# Patient Record
Sex: Female | Born: 1945 | Race: White | Hispanic: No | Marital: Married | Smoking: Never smoker
Health system: Southern US, Community
[De-identification: ages and names within clinical notes are randomized; demographics above are authoritative.]

## PROBLEM LIST (undated history)

## (undated) DIAGNOSIS — F329 Major depressive disorder, single episode, unspecified: Secondary | ICD-10-CM

## (undated) DIAGNOSIS — R300 Dysuria: Secondary | ICD-10-CM

## (undated) DIAGNOSIS — Z9114 Patient's other noncompliance with medication regimen: Secondary | ICD-10-CM

## (undated) DIAGNOSIS — M545 Low back pain, unspecified: Secondary | ICD-10-CM

## (undated) DIAGNOSIS — J45909 Unspecified asthma, uncomplicated: Secondary | ICD-10-CM

## (undated) DIAGNOSIS — R600 Localized edema: Secondary | ICD-10-CM

## (undated) DIAGNOSIS — K209 Esophagitis, unspecified without bleeding: Secondary | ICD-10-CM

## (undated) DIAGNOSIS — L03115 Cellulitis of right lower limb: Secondary | ICD-10-CM

## (undated) DIAGNOSIS — I10 Essential (primary) hypertension: Secondary | ICD-10-CM

## (undated) DIAGNOSIS — R51 Headache: Secondary | ICD-10-CM

## (undated) DIAGNOSIS — G4700 Insomnia, unspecified: Secondary | ICD-10-CM

## (undated) DIAGNOSIS — Z8709 Personal history of other diseases of the respiratory system: Secondary | ICD-10-CM

## (undated) DIAGNOSIS — L02415 Cutaneous abscess of right lower limb: Secondary | ICD-10-CM

## (undated) DIAGNOSIS — R112 Nausea with vomiting, unspecified: Secondary | ICD-10-CM

## (undated) DIAGNOSIS — R7303 Prediabetes: Secondary | ICD-10-CM

## (undated) DIAGNOSIS — Z91148 Patient's other noncompliance with medication regimen for other reason: Secondary | ICD-10-CM

## (undated) DIAGNOSIS — R059 Cough, unspecified: Secondary | ICD-10-CM

## (undated) DIAGNOSIS — R351 Nocturia: Secondary | ICD-10-CM

## (undated) DIAGNOSIS — N3020 Other chronic cystitis without hematuria: Secondary | ICD-10-CM

## (undated) DIAGNOSIS — Z713 Dietary counseling and surveillance: Secondary | ICD-10-CM

## (undated) DIAGNOSIS — R079 Chest pain, unspecified: Secondary | ICD-10-CM

## (undated) DIAGNOSIS — R011 Cardiac murmur, unspecified: Secondary | ICD-10-CM

## (undated) DIAGNOSIS — D1730 Benign lipomatous neoplasm of skin and subcutaneous tissue of unspecified sites: Secondary | ICD-10-CM

## (undated) DIAGNOSIS — E7800 Pure hypercholesterolemia, unspecified: Secondary | ICD-10-CM

## (undated) DIAGNOSIS — F32A Depression, unspecified: Secondary | ICD-10-CM

## (undated) DIAGNOSIS — E559 Vitamin D deficiency, unspecified: Secondary | ICD-10-CM

## (undated) DIAGNOSIS — Z9889 Other specified postprocedural states: Secondary | ICD-10-CM

## (undated) DIAGNOSIS — Z79899 Other long term (current) drug therapy: Secondary | ICD-10-CM

## (undated) DIAGNOSIS — L02419 Cutaneous abscess of limb, unspecified: Secondary | ICD-10-CM

## (undated) DIAGNOSIS — T8859XA Other complications of anesthesia, initial encounter: Secondary | ICD-10-CM

## (undated) DIAGNOSIS — R519 Headache, unspecified: Secondary | ICD-10-CM

## (undated) DIAGNOSIS — R1904 Left lower quadrant abdominal swelling, mass and lump: Secondary | ICD-10-CM

## (undated) DIAGNOSIS — T4145XA Adverse effect of unspecified anesthetic, initial encounter: Secondary | ICD-10-CM

## (undated) DIAGNOSIS — F419 Anxiety disorder, unspecified: Secondary | ICD-10-CM

## (undated) DIAGNOSIS — D649 Anemia, unspecified: Secondary | ICD-10-CM

## (undated) DIAGNOSIS — R21 Rash and other nonspecific skin eruption: Secondary | ICD-10-CM

## (undated) DIAGNOSIS — R5381 Other malaise: Secondary | ICD-10-CM

## (undated) DIAGNOSIS — R05 Cough: Secondary | ICD-10-CM

## (undated) DIAGNOSIS — M1990 Unspecified osteoarthritis, unspecified site: Secondary | ICD-10-CM

## (undated) DIAGNOSIS — L03119 Cellulitis of unspecified part of limb: Secondary | ICD-10-CM

## (undated) DIAGNOSIS — R5383 Other fatigue: Secondary | ICD-10-CM

## (undated) DIAGNOSIS — I639 Cerebral infarction, unspecified: Secondary | ICD-10-CM

## (undated) DIAGNOSIS — IMO0001 Reserved for inherently not codable concepts without codable children: Secondary | ICD-10-CM

## (undated) DIAGNOSIS — M76899 Other specified enthesopathies of unspecified lower limb, excluding foot: Secondary | ICD-10-CM

## (undated) DIAGNOSIS — K219 Gastro-esophageal reflux disease without esophagitis: Secondary | ICD-10-CM

## (undated) DIAGNOSIS — G8929 Other chronic pain: Secondary | ICD-10-CM

## (undated) DIAGNOSIS — K5900 Constipation, unspecified: Secondary | ICD-10-CM

## (undated) HISTORY — PX: SALIVARY GLAND SURGERY: SHX768

## (undated) HISTORY — PX: CARDIAC CATHETERIZATION: SHX172

## (undated) HISTORY — PX: TUMOR EXCISION: SHX421

## (undated) HISTORY — PX: ANKLE SURGERY: SHX546

## (undated) HISTORY — PX: APPENDECTOMY: SHX54

## (undated) HISTORY — PX: ABDOMINAL HYSTERECTOMY: SHX81

---

## 2006-05-18 ENCOUNTER — Encounter: Admission: RE | Admit: 2006-05-18 | Discharge: 2006-05-18 | Payer: Self-pay | Admitting: Neurosurgery

## 2009-01-04 DIAGNOSIS — E785 Hyperlipidemia, unspecified: Secondary | ICD-10-CM | POA: Insufficient documentation

## 2010-02-27 ENCOUNTER — Encounter: Payer: Self-pay | Admitting: Neurosurgery

## 2013-05-07 ENCOUNTER — Other Ambulatory Visit: Payer: Self-pay | Admitting: Internal Medicine

## 2013-05-07 DIAGNOSIS — M5416 Radiculopathy, lumbar region: Secondary | ICD-10-CM

## 2013-05-15 ENCOUNTER — Ambulatory Visit
Admission: RE | Admit: 2013-05-15 | Discharge: 2013-05-15 | Disposition: A | Discharge status: Home / Self Care | Payer: BC Managed Care – PPO | Source: Ambulatory Visit | Attending: Internal Medicine | Admitting: Internal Medicine

## 2013-05-15 DIAGNOSIS — M5416 Radiculopathy, lumbar region: Secondary | ICD-10-CM

## 2015-07-16 ENCOUNTER — Other Ambulatory Visit: Payer: Self-pay | Admitting: Anesthesiology

## 2015-07-20 ENCOUNTER — Encounter (HOSPITAL_COMMUNITY): Payer: Self-pay

## 2015-07-20 ENCOUNTER — Other Ambulatory Visit: Payer: Self-pay

## 2015-07-20 ENCOUNTER — Emergency Department (HOSPITAL_COMMUNITY)
Admission: EM | Admit: 2015-07-20 | Discharge: 2015-07-20 | Disposition: A | Discharge status: Home / Self Care | Payer: Medicare Other | Attending: Emergency Medicine | Admitting: Emergency Medicine

## 2015-07-20 ENCOUNTER — Encounter (HOSPITAL_COMMUNITY)
Admission: RE | Admit: 2015-07-20 | Discharge: 2015-07-20 | Disposition: A | Discharge status: Home / Self Care | Payer: Medicare Other | Source: Ambulatory Visit | Attending: Anesthesiology | Admitting: Anesthesiology

## 2015-07-20 ENCOUNTER — Encounter (HOSPITAL_COMMUNITY): Payer: Self-pay | Admitting: Vascular Surgery

## 2015-07-20 ENCOUNTER — Encounter (HOSPITAL_COMMUNITY): Payer: Self-pay | Admitting: Nurse Practitioner

## 2015-07-20 ENCOUNTER — Emergency Department (HOSPITAL_COMMUNITY): Payer: Medicare Other

## 2015-07-20 DIAGNOSIS — Z91010 Allergy to peanuts: Secondary | ICD-10-CM | POA: Diagnosis not present

## 2015-07-20 DIAGNOSIS — F419 Anxiety disorder, unspecified: Secondary | ICD-10-CM | POA: Insufficient documentation

## 2015-07-20 DIAGNOSIS — M4316 Spondylolisthesis, lumbar region: Secondary | ICD-10-CM | POA: Insufficient documentation

## 2015-07-20 DIAGNOSIS — K219 Gastro-esophageal reflux disease without esophagitis: Secondary | ICD-10-CM | POA: Insufficient documentation

## 2015-07-20 DIAGNOSIS — Z8673 Personal history of transient ischemic attack (TIA), and cerebral infarction without residual deficits: Secondary | ICD-10-CM | POA: Insufficient documentation

## 2015-07-20 DIAGNOSIS — J45909 Unspecified asthma, uncomplicated: Secondary | ICD-10-CM | POA: Insufficient documentation

## 2015-07-20 DIAGNOSIS — R079 Chest pain, unspecified: Secondary | ICD-10-CM | POA: Diagnosis not present

## 2015-07-20 DIAGNOSIS — E7800 Pure hypercholesterolemia, unspecified: Secondary | ICD-10-CM | POA: Insufficient documentation

## 2015-07-20 DIAGNOSIS — M1990 Unspecified osteoarthritis, unspecified site: Secondary | ICD-10-CM | POA: Insufficient documentation

## 2015-07-20 DIAGNOSIS — G8929 Other chronic pain: Secondary | ICD-10-CM | POA: Insufficient documentation

## 2015-07-20 DIAGNOSIS — Z01818 Encounter for other preprocedural examination: Secondary | ICD-10-CM | POA: Diagnosis not present

## 2015-07-20 DIAGNOSIS — R51 Headache: Secondary | ICD-10-CM | POA: Diagnosis not present

## 2015-07-20 DIAGNOSIS — M5416 Radiculopathy, lumbar region: Secondary | ICD-10-CM | POA: Insufficient documentation

## 2015-07-20 DIAGNOSIS — I10 Essential (primary) hypertension: Secondary | ICD-10-CM | POA: Insufficient documentation

## 2015-07-20 DIAGNOSIS — F329 Major depressive disorder, single episode, unspecified: Secondary | ICD-10-CM | POA: Insufficient documentation

## 2015-07-20 DIAGNOSIS — Z79899 Other long term (current) drug therapy: Secondary | ICD-10-CM | POA: Insufficient documentation

## 2015-07-20 DIAGNOSIS — R7303 Prediabetes: Secondary | ICD-10-CM | POA: Diagnosis not present

## 2015-07-20 DIAGNOSIS — Z01812 Encounter for preprocedural laboratory examination: Secondary | ICD-10-CM | POA: Insufficient documentation

## 2015-07-20 HISTORY — DX: Other specified postprocedural states: Z98.890

## 2015-07-20 HISTORY — DX: Headache: R51

## 2015-07-20 HISTORY — DX: Cerebral infarction, unspecified: I63.9

## 2015-07-20 HISTORY — DX: Headache, unspecified: R51.9

## 2015-07-20 HISTORY — DX: Esophagitis, unspecified: K20.9

## 2015-07-20 HISTORY — DX: Prediabetes: R73.03

## 2015-07-20 HISTORY — DX: Other chronic cystitis without hematuria: N30.20

## 2015-07-20 HISTORY — DX: Depression, unspecified: F32.A

## 2015-07-20 HISTORY — DX: Anxiety disorder, unspecified: F41.9

## 2015-07-20 HISTORY — DX: Low back pain: M54.5

## 2015-07-20 HISTORY — DX: Patient's other noncompliance with medication regimen: Z91.14

## 2015-07-20 HISTORY — DX: Pure hypercholesterolemia, unspecified: E78.00

## 2015-07-20 HISTORY — DX: Personal history of other diseases of the respiratory system: Z87.09

## 2015-07-20 HISTORY — DX: Esophagitis, unspecified without bleeding: K20.90

## 2015-07-20 HISTORY — DX: Other fatigue: R53.83

## 2015-07-20 HISTORY — DX: Adverse effect of unspecified anesthetic, initial encounter: T41.45XA

## 2015-07-20 HISTORY — DX: Nausea with vomiting, unspecified: R11.2

## 2015-07-20 HISTORY — DX: Cough: R05

## 2015-07-20 HISTORY — DX: Major depressive disorder, single episode, unspecified: F32.9

## 2015-07-20 HISTORY — DX: Other specified enthesopathies of unspecified lower limb, excluding foot: M76.899

## 2015-07-20 HISTORY — DX: Dietary counseling and surveillance: Z71.3

## 2015-07-20 HISTORY — DX: Vitamin D deficiency, unspecified: E55.9

## 2015-07-20 HISTORY — DX: Unspecified osteoarthritis, unspecified site: M19.90

## 2015-07-20 HISTORY — DX: Nocturia: R35.1

## 2015-07-20 HISTORY — DX: Other malaise: R53.81

## 2015-07-20 HISTORY — DX: Reserved for inherently not codable concepts without codable children: IMO0001

## 2015-07-20 HISTORY — DX: Other chronic pain: G89.29

## 2015-07-20 HISTORY — DX: Essential (primary) hypertension: I10

## 2015-07-20 HISTORY — DX: Patient's other noncompliance with medication regimen for other reason: Z91.148

## 2015-07-20 HISTORY — DX: Gastro-esophageal reflux disease without esophagitis: K21.9

## 2015-07-20 HISTORY — DX: Cough, unspecified: R05.9

## 2015-07-20 HISTORY — DX: Other complications of anesthesia, initial encounter: T88.59XA

## 2015-07-20 HISTORY — DX: Dysuria: R30.0

## 2015-07-20 HISTORY — DX: Left lower quadrant abdominal swelling, mass and lump: R19.04

## 2015-07-20 HISTORY — DX: Rash and other nonspecific skin eruption: R21

## 2015-07-20 HISTORY — DX: Benign lipomatous neoplasm of skin and subcutaneous tissue of unspecified sites: D17.30

## 2015-07-20 HISTORY — DX: Chest pain, unspecified: R07.9

## 2015-07-20 HISTORY — DX: Unspecified asthma, uncomplicated: J45.909

## 2015-07-20 HISTORY — DX: Localized edema: R60.0

## 2015-07-20 HISTORY — DX: Cardiac murmur, unspecified: R01.1

## 2015-07-20 HISTORY — DX: Constipation, unspecified: K59.00

## 2015-07-20 HISTORY — DX: Low back pain, unspecified: M54.50

## 2015-07-20 HISTORY — DX: Other long term (current) drug therapy: Z79.899

## 2015-07-20 HISTORY — DX: Insomnia, unspecified: G47.00

## 2015-07-20 HISTORY — DX: Anemia, unspecified: D64.9

## 2015-07-20 LAB — CBC
HCT: 41.6 % (ref 36.0–46.0)
HEMOGLOBIN: 12.7 g/dL (ref 12.0–15.0)
MCH: 25.5 pg — AB (ref 26.0–34.0)
MCHC: 30.5 g/dL (ref 30.0–36.0)
MCV: 83.5 fL (ref 78.0–100.0)
Platelets: 277 10*3/uL (ref 150–400)
RBC: 4.98 MIL/uL (ref 3.87–5.11)
RDW: 14.5 % (ref 11.5–15.5)
WBC: 9 10*3/uL (ref 4.0–10.5)

## 2015-07-20 LAB — BASIC METABOLIC PANEL
Anion gap: 9 (ref 5–15)
Anion gap: 7 (ref 5–15)
BUN: 15 mg/dL (ref 6–20)
BUN: 13 mg/dL (ref 6–20)
CALCIUM: 9.2 mg/dL (ref 8.9–10.3)
CHLORIDE: 106 mmol/L (ref 101–111)
CHLORIDE: 104 mmol/L (ref 101–111)
CO2: 24 mmol/L (ref 22–32)
CO2: 28 mmol/L (ref 22–32)
CREATININE: 0.77 mg/dL (ref 0.44–1.00)
CREATININE: 0.81 mg/dL (ref 0.44–1.00)
Calcium: 9.4 mg/dL (ref 8.9–10.3)
GFR calc Af Amer: >60 mL/min (ref >60)
GFR calc non Af Amer: >60 mL/min (ref >60)
GFR calc non Af Amer: >60 mL/min (ref >60)
GLUCOSE: 80 mg/dL (ref 65–99)
GLUCOSE: 127 mg/dL — AB (ref 65–99)
POTASSIUM: 4 mmol/L (ref 3.5–5.1)
Potassium: 4.1 mmol/L (ref 3.5–5.1)
SODIUM: 139 mmol/L (ref 135–145)
Sodium: 139 mmol/L (ref 135–145)

## 2015-07-20 LAB — CBC WITH DIFFERENTIAL/PLATELET
Basophils Absolute: 0 10*3/uL (ref 0.0–0.1)
Basophils Relative: 0 %
Eosinophils Absolute: 0.6 10*3/uL (ref 0.0–0.7)
Eosinophils Relative: 7 %
HEMATOCRIT: 40 % (ref 36.0–46.0)
HEMOGLOBIN: 12.2 g/dL (ref 12.0–15.0)
LYMPHS ABS: 2.7 10*3/uL (ref 0.7–4.0)
LYMPHS PCT: 29 %
MCH: 25.4 pg — AB (ref 26.0–34.0)
MCHC: 30.5 g/dL (ref 30.0–36.0)
MCV: 83.2 fL (ref 78.0–100.0)
MONO ABS: 0.5 10*3/uL (ref 0.1–1.0)
MONOS PCT: 6 %
NEUTROS ABS: 5.4 10*3/uL (ref 1.7–7.7)
NEUTROS PCT: 58 %
Platelets: 279 10*3/uL (ref 150–400)
RBC: 4.81 MIL/uL (ref 3.87–5.11)
RDW: 14.6 % (ref 11.5–15.5)
WBC: 9.3 10*3/uL (ref 4.0–10.5)

## 2015-07-20 LAB — I-STAT TROPONIN, ED
TROPONIN I, POC: 0.01 ng/mL (ref 0.00–0.08)
Troponin i, poc: 0.01 ng/mL (ref 0.00–0.08)

## 2015-07-20 LAB — SURGICAL PCR SCREEN
MRSA, PCR: NEGATIVE
Staphylococcus aureus: NEGATIVE

## 2015-07-20 MED ORDER — METOPROLOL TARTRATE 25 MG PO TABS
100.0000 mg | ORAL_TABLET | Freq: Once | ORAL | Status: AC
  Administered 2015-07-20: 100 mg via ORAL
  Filled 2015-07-20: qty 4

## 2015-07-20 NOTE — ED Provider Notes (Signed)
CSN: [id number]     Arrival date & time 07/20/15  1720 History   First MD Initiated Contact with Patient 07/20/15 1946     Chief Complaint  Patient presents with  . Chest Pain    HPI Pt was getting a pre op evaluation for a spinal cord stimulator.  WHile she was there they noticed that her blood pressure was very elevated.     SHe has not felt well today.  She thought it might be her anxiety.   She has been having trouble with her chest hurting her.  Off and on this has been happening for a year or two.  She sees cardiologist a danville who is managing it.  SHe has a history of her having high blood pressure and what sounds like renal artery stenosis.  Her doctors have had to do multiple adjustments of her blood pressure over the years.  She has not had much to eat today because of the preop evaluation and she did not take her blood pressure medications was early this morning. She denies shortness of breath.  No vomiting or diarrhea. Past Medical History  Diagnosis Date  . Complication of anesthesia     "stopped breathing with tumor removal surgery, not happened since then"  . PONV (postoperative nausea and vomiting)   . Asthma   . Edema extremities   . Hypertension   . Vitamin D deficiency   . Noncompliance with medications   . Chronic pain   . Ischemic cerebrovascular accident (CVA) of frontal lobe ([place])   . Low back pain   . High risk medication use   . GERD (gastroesophageal reflux disease)   . Anemia   . Abdominal mass, LLQ (left lower quadrant)   . Dysuria   . Osteoarthritis   . Enthesopathy of knee   . Dietary counseling   . Depression   . Anxiety   . Prediabetes   . Nocturia   . Rash   . Hypercholesterolemia   . Chest pain   . Malaise and fatigue   . Cough   . Insomnia   . Lipoma of skin   . Constipation   . Esophagitis   . Heart murmur   . Shortness of breath dyspnea   . History of bronchitis   . Bladder infection, chronic   . Headache    Past Surgical  History  Procedure Laterality Date  . Salivary gland surgery    . Abdominal hysterectomy    . Ankle surgery Right   . Appendectomy    . Tumor excision     History reviewed. No pertinent family history. Social History  Substance Use Topics  . Smoking status: Never Smoker   . Smokeless tobacco: Never Used  . Alcohol Use: No   OB History    No data available     Review of Systems  All other systems reviewed and are negative.     Allergies  Codeine; Penicillins; Aspirin; Cephalexin; Iodinated diagnostic agents; Morphine; Nitrofuran derivatives; Peanut (diagnostic); Shellfish allergy; Sulfa antibiotics; and Tetracycline  Home Medications   Prior to Admission medications   Medication Sig Start Date End Date Taking? Authorizing Provider  acetaminophen (TYLENOL) 500 MG tablet Take 1,000 mg by mouth every 6 (six) hours as needed for moderate pain.    Historical Provider, MD  albuterol (PROVENTIL) (2.5 MG/3ML) 0.083% nebulizer solution Take 2.5 mg by nebulization every 6 (six) hours as needed for wheezing or shortness of breath.    Historical Provider,  MD  atorvastatin (LIPITOR) 40 MG tablet Take 40 mg by mouth daily.    Historical Provider, MD  butalbital-acetaminophen-caffeine (FIORICET, ESGIC) 50-325-40 MG tablet Take 1 tablet by mouth every 4 (four) hours as needed for headache.    Historical Provider, MD  calcium carbonate (TUMS - DOSED IN MG ELEMENTAL CALCIUM) 500 MG chewable tablet Chew 1 tablet by mouth daily.    Historical Provider, MD  chlorthalidone (HYGROTON) 25 MG tablet Take 25 mg by mouth daily.    Historical Provider, MD  clonazePAM (KLONOPIN) 0.5 MG tablet Take 0.5 mg by mouth 2 (two) times daily as needed for anxiety.    Historical Provider, MD  cyclobenzaprine (FLEXERIL) 10 MG tablet Take 10 mg by mouth 3 (three) times daily as needed for muscle spasms.    Historical Provider, MD  diphenhydrAMINE (BENADRYL) 25 mg capsule Take 25 mg by mouth every 6 (six) hours as  needed for allergies.    Historical Provider, MD  divalproex (DEPAKOTE ER) 500 MG 24 hr tablet Take 500 mg by mouth daily.    Historical Provider, MD  ferrous sulfate 325 (65 FE) MG tablet Take 325 mg by mouth daily with breakfast.    Historical Provider, MD  furosemide (LASIX) 40 MG tablet Take 40 mg by mouth daily as needed for fluid or edema.    Historical Provider, MD  HYDROcodone-acetaminophen (NORCO) 10-325 MG tablet Take 1 tablet by mouth every 6 (six) hours as needed.    Historical Provider, MD  isosorbide dinitrate (ISORDIL) 30 MG tablet Take 30 mg by mouth at bedtime.    Historical Provider, MD  lansoprazole (PREVACID) 30 MG capsule Take 30 mg by mouth daily at 12 noon.    Historical Provider, MD  lisinopril (PRINIVIL,ZESTRIL) 20 MG tablet Take 20 mg by mouth daily.    Historical Provider, MD  magnesium citrate ([employer] MAGNESIUM CITRATE) SOLN Take 0.5 Bottles by mouth daily.    Historical Provider, MD  Melatonin 3 MG TABS Take 3 mg by mouth at bedtime.    Historical Provider, MD  metoprolol (LOPRESSOR) 100 MG tablet Take 100 mg by mouth 2 (two) times daily.    Historical Provider, MD  NIFEdipine (PROCARDIA-XL/ADALAT CC) 60 MG 24 hr tablet Take 60 mg by mouth daily.    Historical Provider, MD  potassium chloride SA (K-DUR,KLOR-CON) 20 MEQ tablet Take 20 mEq by mouth daily.    Historical Provider, MD  ranitidine (ZANTAC) 150 MG tablet Take 150 mg by mouth at bedtime.    Historical Provider, MD  sertraline (ZOLOFT) 25 MG tablet Take 25 mg by mouth daily.    Historical Provider, MD  spironolactone (ALDACTONE) 25 MG tablet Take 25 mg by mouth daily.    Historical Provider, MD  traZODone (DESYREL) 50 MG tablet Take 50 mg by mouth at bedtime.    Historical Provider, MD  Vitamin D, Ergocalciferol, (DRISDOL) 50000 units CAPS capsule Take 50,000 Units by mouth 3 (three) times a week.    Historical Provider, MD   BP 216/112 mmHg  Pulse 62  Temp(Src) 98 F (36.7 C) (Oral)  Resp 18  SpO2  96% Physical Exam  Constitutional: She appears well-developed and well-nourished. No distress.  HENT:  Head: Normocephalic and atraumatic.  Right Ear: External ear normal.  Left Ear: External ear normal.  Eyes: Conjunctivae are normal. Right eye exhibits no discharge. Left eye exhibits no discharge. No scleral icterus.  Neck: Neck supple. No tracheal deviation present.  Cardiovascular: Normal rate, regular rhythm and intact distal  pulses.   Pulmonary/Chest: Effort normal and breath sounds normal. No stridor. No respiratory distress. She has no wheezes. She has no rales.  Abdominal: Soft. Bowel sounds are normal. She exhibits no distension. There is no tenderness. There is no rebound and no guarding.  Musculoskeletal: She exhibits no edema or tenderness.  Neurological: She is alert. She has normal strength. She displays atrophy. No cranial nerve deficit (no facial droop, extraocular movements intact, no slurred speech) or sensory deficit. She exhibits normal muscle tone. She displays no seizure activity. Coordination normal.  Weakness lower extremities (pt uses a scooter)  Skin: Skin is warm and dry. No rash noted.  Psychiatric: She has a normal mood and affect.  Nursing note and vitals reviewed.   ED Course  Procedures (including critical care time) [place], ED    Imaging Review Dg Chest 2 View  07/20/2015  CLINICAL DATA:  Intermittent chest pain for several months. EXAM: CHEST  2 VIEW COMPARISON:  None FINDINGS: Lungs are clear without airspace disease or pulmonary edema. Dextroscoliosis of the thoracolumbar spine. Heart size is within normal limits. No large pleural effusions. IMPRESSION: No active cardiopulmonary disease. Scoliosis. Electronically Signed   By: Markus Daft M.D.   On: 07/20/2015 18:23   I have personally reviewed and evaluated these images and lab results as part of my medical decision-making.   EKG  Interpretation  Date/Time:  Tuesday July 20 2015 17:25:30 EDT Ventricular Rate:  62 PR Interval:  152 QRS Duration: 82 QT Interval:  416 QTC Calculation: 422 R Axis:   -33 Text Interpretation:  Normal sinus rhythm Left axis deviation Abnormal ECG No significant change since last tracing Confirmed by Laquida Cotrell  MD-J, Candence Sease [id number]) on 07/20/2015 10:22:47 PM         MDM   Final diagnoses:  Essential hypertension    Pt sent to the ED for persistent hypertension and episodes of chest pain which is not new for her.  She is under the care of a cardiologist in [place] for this.   Heart Score 4, moderate risk.  Sx are atypical.  ED evaluation is reassuring.  2 sets of cardiac enzymes negative.  Doubt ACS.  BP improved with her evening dose of metoprolol.   Systolic of 0000000 at the bedside.  Pt is ready to go home.  Will follow up with her cardiologist regarding pre op clearance  Dorie Rank, MD 07/20/15 2225

## 2015-07-20 NOTE — Progress Notes (Addendum)
Anesthesia note: Patient is a 70 year old female scheduled for lumbar spinal cord stimulator insertion on 07/23/15 (first case) by Dr. Maryjean Ka.  She resides in [place], [place].  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 She reported respiratory insufficiency with tumor removal surgery.  - PCP is Dr. Doristine Mango in [place], [place]. Last visit 04/30/15. - Neurologist is Dr. Barb Merino, reportedly seen within the last month.  - Cardiologist is Dr. Carrolyn Leigh, reportedly seen within the past month. Reports history of cardiac cath within the past five years (no PCI) and stress test ~ 10 years ago.  She reports taking metoprolol 50mg  PRN for chest tightness.  Meds include albuterol, Lipitor, Fioricet, Tums, chlorthalidone 25mg  daily, Klonopin, Flexeril, Depakote ER, 65 Fe, Lasix 40mg  daily PRN, Norco, isosorbide dinitrate 30mg  at HS, Prevacid, lisinopril 20mg  daily, melatonin, metoprolol 100mg  BID, nifedipine XL 60mg  daily, KCl 33mEq daily, ranitidine, Zoloft, spironolactone 25mg  daily, trazodone, Vitamin D. She took her AM medications just after 7AM this morning and took a metoprolol 50mg  tablet around 1PM for chest tightness.  PAT Vitals: BP 230/105, 209/108, 202/93, HR 58, RR 18, T 37.4C, O2 sat 99%. WT 120 lb, 54 kg. BMI 22.69. (I spoke with Anderson Malta at Dr. Donell Sievert office. She reports BPs there have been SBP 170-197/84-100. BP on 04/30/15 at her PCP office was 150/80 and 170/90, both in her LUE.) I rechecked BP manually and got 212/110 LUE and 190/104  RUE (done just before transfer to ED).   Exam shows a pleasant Caucasian female in NAD. She is in a motorized chair. Her husband is at her side. Heart RRR, I/VI SEM. Lungs clear. She is wearing BLE compression stockings. Facial symmetry. She has RLE weakness and valgus deformity of her RLE (not new per patient and her husband). Her right grip seems slightly weaker than her left (patient has not noticed, but said this was mentioned by Dr. Casandra Doffing in the past). She reports 6/10 headache today which is not unusual for her. She gets occasional blurry vision, but denied any currently. She also reports that up to once a week she takes metoprolol 50mg  tablets for chest tightness with symptoms primarily in her upper chest to sternal notch region. Symptoms are occasionally associated with anxiety, diaphoresis, palpitations, and SOB (feeling like she needs to take deep breaths). Symptoms are not exacerbated by exercise, but admits that her activity is limited (uses a cane at home, but motorized chair when out of the house). Her last episode was around 1PM today, but said symptoms were improving by the time she arrived in PAT. She thinks she had a cardiac cath about five years ago that showed no significant blockages. She has not been told that her murmur was significant. On the other hand, she and her husband mentioned that she was declined for previous TKA (at [facility]) and a "12-13 hour" back surgery [facility]) because of her cardiac status. She thinks she had an echo within the last five years. Currently, I do not have any recent cardiology records, but requests have been requested.   07/20/15 EKG (in PAT): SR with PACs, LVH with repolarization abnormality.  She underwent renal arteriogram 6/26/ for refractory HTN (  was in the HTN-3 Study at [facility]). Results (see Care Everywhere) showed: Impression: 1. Abdominal aortogram and bilateral selective renal arteriograms for evaluation of HTN-3 study. Normal angiogram 2. The  patient was randomized per protocol.  01/16/11 Renal U/S ([facility]; Care Everywhere): Conclusions:  The right kidney was normal in size. There was no evidence of occlusive disease in the right renal artery. The left kidney was normal in size. There was no evidence of occlusive disease in the left renal artery.  09/14/00 Echo ([facility]; Care Everywhere): INTERPRETATION --------------------------------------------------------------- NORMAL LEFT VENTRICULAR FUNCTION WITH MILD LVH VALVULAR REGURGITATION: TRIVIAL MR NO VALVULAR STENOSIS  CBC and BMET results noted.  PAT RN called Dr. Real Cons office this afternoon, but was on hold for 30 minutes and never spoke with anyone. We were attempting to get more information about her cardiac status and also direction on how best to manage her uncontrolled hypertension acutely and in the long-term. Since we were unable to get in touch with Dr. Gibson Ramp late this afternoon, I discussed further with anesthesiologist Dr. Therisa Doyne. In a patient with unknown cardiac status with (recurrent) chest pain this afternoon, significantly elevated BP with headache would advise the safest course of action would be for her to be further evaluated in the ED. I discussed with patient and her husband who are agreeable to transfer to the ED. I escorted them to the ED and reported off to ED staff there. I also notified Anderson Malta at Dr. Donell Sievert office this afternoon of uncontrolled BP and need for pre-operative input for cardiology. (Even if ultimately "cleared" by cardiology, a significantly elevated BP on the day of her procedure may still delay or cancel her procedure.) I will follow-up on happenings during/after her ED visit and plan to update Dr. Donell Sievert staff tomorrow.  George Hugh [facility] Short Stay Center/Anesthesiology Phone [phone] 07/20/2015 6:02 PM  Addendum: Last office note from Dr. Gibson Ramp just received. She was seen on 05/19/15. She apparently had had a  recent hospitalization for hypertension. At follow-up, her home SBP were reported as 160-170's. If her BP was still elevated at her six month follow-up then he would consider increasing her nifedipine. Overall, he felt she was doing well from a cardiac standpoint. By notes, "12/13/2012 Cath LMCA: nl LAD: mid40% Cx: nl RCA: nl, DHV." 07/21/14 Hospital echo "EF 60/LVH diast dysf/RVE/LAE/mild TR, MR."  06/25/14 Echo ([facility]): Conclusions: 1. LV normal in size and systolic function. Estimated EF 60-65%. Mild to moderate concentric LVH with basal asymmetric septal hypertrophy. Grade 1 diastolic dysfunction suggestive of impaired relaxation. 2. Right ventricle is normal in size and systolic function. 3. Moderate biatrial dilatation. 4. The AV is calcified with mild AS (mean gradient 9 mmHg, peak velocity 1.9 m/s). 5. No other hemodynamically significant valvular abnormalities.  [facility] records still pending.  George Hugh [facility] Short Stay Center/Anesthesiology Phone [phone] 07/20/2015 6:42 PM  Addendum: [facility] ED records reviewed. She was discharged last night. BP was down to 188/70 at discharge after her evening dose of metoprolol. She was to follow-up with her cardiologist regarding pre-operative clearance and HTN. Cardiac enzymes were negative X 2.   [facility] records received. To clarify her possible CVA/seizure history, records indicate that she was admitted 07/19/14-07/21/14 for altered mental status (she told me she couldn't remember her bank card pin number) and hypertensive emergency. MRI showed no acute infarct, but progressive white matter disease. EEG was cut short (by patient) and showed findings that could implicate a  decrease in seizure threshold although no electrographioc subclinical seizure. There was no significant carotid artery disease, no afib by EKG, and no thrombus seen on echo. She was discharged with cardiology and neurology follow-up. Patient has since bee  started on Depakote, although I do not currently have any recent neurology records.   2014 cath report was not faxed. Will request.   07/21/14 Echo: Impression: 1. Normal LV systolic function. EF 60%. 2. Concentric LVH and diastolic dysfunction. 3. Dilated RV. 4. Dilated LA. 5. Mild TR and aortic incompetence.  07/20/14 Carotid U/S: Impression: < 50% bilateral ICA stenosis.  07/20/14 EEG: Study was stopped halfway through due to the patient's request asking the test to be stopped as per EEG technician. There are mild cortical, neuronal, physiological, and/or structural dysfunction over the left temporal lobe, left more than right. This could implicate some mild decreased threshold for seizure for seizure disorder. If clinically warranted, advice to repeat the test. Again, no electrographic subclinical seizure.   07/20/14 MRI brain: Impressions: 1. No acute infarct. 2. Progression of mild to moderate white matter disease, statistically suggestive of small vessel disease. Correlation for demyelinating disease is recommended however.   I have updated anesthesiologist Dr. Therisa Doyne and Anderson Malta at Dr. Maryjean Ka office. Dr. Donell Sievert office did fax a clearance request to Dr. Gibson Ramp this morning, but Dr. Maryjean Ka is considering postponing surgery until July in hopes to get BP better controlled. Case is currently still posted for 07/23/15, so for now chart will be left for follow-up regarding cardiac clearance. Anderson Malta is aware that a significantly elevated could delay or cancel her procedure.  George Hugh [facility] Short Stay Center/Anesthesiology Phone [phone] 07/21/2015 1:52 PM  Addendum: Surgery has been rescheduled for 08/20/15.   12/13/12 cath report received from [facility] that showed: FINDINGS: 1. RCA is a large dominant vessel free of angiographically significant disease. 2. LM is a short vessel that bifurcates and is free of angiographically significant disease. 3. LAD is a large  vessel that wraps around and supplies the apex. It gives off 1 major diagonal vessel. The LAD itself has a 30-40% lesion in the mid portion of the vessel but is free of obstructive disease.  4. CX is a large vessel that gives off 1 large branching marginal vessel. The CX and the marginal system are free of disease. 5. LV end-diastolic pressure was 27 mmHg. There is no stenosis on pullback across the AV. Recommendations: Non-obstructive CAD.  George Hugh [facility] Short Stay Center/Anesthesiology Phone [phone] 07/21/2015 4:06 PM

## 2015-07-20 NOTE — Discharge Instructions (Signed)
Hypertension Hypertension, commonly called high blood pressure, is when the force of blood pumping through your arteries is too strong. Your arteries are the blood vessels that carry blood from your heart throughout your body. A blood pressure reading consists of a higher number over a lower number, such as 110/72. The higher number (systolic) is the pressure inside your arteries when your heart pumps. The lower number (diastolic) is the pressure inside your arteries when your heart relaxes. Ideally you want your blood pressure below 120/80. Hypertension forces your heart to work harder to pump blood. Your arteries may become narrow or stiff. Having untreated or uncontrolled hypertension can cause heart attack, stroke, kidney disease, and other problems. RISK FACTORS Some risk factors for high blood pressure are controllable. Others are not.  Risk factors you cannot control include:   Race. You may be at higher risk if you are African American.  Age. Risk increases with age.  Gender. Men are at higher risk than women before age 45 years. After age 65, women are at higher risk than men. Risk factors you can control include:  Not getting enough exercise or physical activity.  Being overweight.  Getting too much fat, sugar, calories, or salt in your diet.  Drinking too much alcohol. SIGNS AND SYMPTOMS Hypertension does not usually cause signs or symptoms. Extremely high blood pressure (hypertensive crisis) may cause headache, anxiety, shortness of breath, and nosebleed. DIAGNOSIS To check if you have hypertension, your health care provider will measure your blood pressure while you are seated, with your arm held at the level of your heart. It should be measured at least twice using the same arm. Certain conditions can cause a difference in blood pressure between your right and left arms. A blood pressure reading that is higher than normal on one occasion does not mean that you need treatment. If  it is not clear whether you have high blood pressure, you may be asked to return on a different day to have your blood pressure checked again. Or, you may be asked to monitor your blood pressure at home for 1 or more weeks. TREATMENT Treating high blood pressure includes making lifestyle changes and possibly taking medicine. Living a healthy lifestyle can help lower high blood pressure. You may need to change some of your habits. Lifestyle changes may include:  Following the DASH diet. This diet is high in fruits, vegetables, and whole grains. It is low in salt, red meat, and added sugars.  Keep your sodium intake below 2,300 mg per day.  Getting at least 30-45 minutes of aerobic exercise at least 4 times per week.  Losing weight if necessary.  Not smoking.  Limiting alcoholic beverages.  Learning ways to reduce stress. Your health care provider may prescribe medicine if lifestyle changes are not enough to get your blood pressure under control, and if one of the following is true:  You are 18-59 years of age and your systolic blood pressure is above 140.  You are 60 years of age or older, and your systolic blood pressure is above 150.  Your diastolic blood pressure is above 90.  You have diabetes, and your systolic blood pressure is over 140 or your diastolic blood pressure is over 90.  You have kidney disease and your blood pressure is above 140/90.  You have heart disease and your blood pressure is above 140/90. Your personal target blood pressure may vary depending on your medical conditions, your age, and other factors. HOME CARE INSTRUCTIONS    Have your blood pressure rechecked as directed by your health care provider.   Take medicines only as directed by your health care provider. Follow the directions carefully. Blood pressure medicines must be taken as prescribed. The medicine does not work as well when you skip doses. Skipping doses also puts you at risk for  problems.  Do not smoke.   Monitor your blood pressure at home as directed by your health care provider. SEEK MEDICAL CARE IF:   You think you are having a reaction to medicines taken.  You have recurrent headaches or feel dizzy.  You have swelling in your ankles.  You have trouble with your vision. SEEK IMMEDIATE MEDICAL CARE IF:  You develop a severe headache or confusion.  You have unusual weakness, numbness, or feel faint.  You have severe chest or abdominal pain.  You vomit repeatedly.  You have trouble breathing. MAKE SURE YOU:   Understand these instructions.  Will watch your condition.  Will get help right away if you are not doing well or get worse.   This information is not intended to replace advice given to you by your health care provider. Make sure you discuss any questions you have with your health care provider.   Document Released: 01/23/2005 Document Revised: 06/09/2014 Document Reviewed: 11/15/2012 Elsevier Interactive Patient Education 2016 [employer]  

## 2015-07-20 NOTE — Progress Notes (Signed)
PCP - Dr. Shon Millet Cardiologist - Dr. Gibson Ramp  EKG - 07/20/15 CXR - denies  Echo/stress test - requested records but pt. Unsure when these were completed Cardiac Cath - requested   Patient's blood pressure 202/93 at PAT appointment and rechecked at 209/108.  Patient states that her blood pressure increases when she visits a doctor's office or hospital but that her pressure has also been running high at home.  Patient informs nurse that she has taken all her blood pressure medications as prescribed and that her cardiologist is aware of her blood pressure.   Patient understands that if blood pressure is elevated the day of surgery that she is at risk for her surgery to be canceled.    Myra Gianotti, PA consulted.

## 2015-07-20 NOTE — ED Notes (Addendum)
Pt c/o several month history of intermittent CP. She reports nausea, sob, headaches. She denies dizziness, cough. She was here today for pre-admission testing for a procedure and they found her BP elevated and wanted her to come to ED for further evaluation. She reports she takes her BP meds daily as prescribed. She is alert and breathing easily

## 2015-07-20 NOTE — Pre-Procedure Instructions (Signed)
Tamara Howe  07/20/2015      [facility] [address], [place] [address] Phone: [phone] Fax: [phone]    Your procedure is scheduled on Friday, July 23, 2015   Report to [facility] Admitting at 5:30 A.M.   Call this number if you have problems the morning of surgery:  [phone]   Remember:  Do not eat food or drink liquids after midnight.   Take these medicines the morning of surgery with A SIP OF WATER: Acetaminophen (Tylenol) if needed, Albuterol nebulizer if needed, Fioricet if needed, Chlorthalidone (Hygroton), Clonazepam (Klonopin), Cyclobenzaprine (Flexeril), Diphenhydramine (Benadryl) if needed, Divalproex (Depakote), Lansoprazole (Prevacid), Metoprolol (Lopressor), Nifedipine (Procardia), Ranitidine (Zantac), Sertraline (Zoloft).     Stop taking: Aspirin, NSAIDS, Aleve, Naproxen, Ibuprofen, Advil, Motrin, BC's, Goody's, Fish oil, all herbal medications, and all vitamins.    Do not wear jewelry, make-up or nail polish.  Do not wear lotions, powders, or perfumes.  You may NOT wear deodorant.  Do not shave 48 hours prior to surgery.    Do not bring valuables to the hospital.   [facility] is not responsible for any belongings or valuables.  Contacts, dentures or bridgework may not be worn into surgery.  Leave your suitcase in the car.  After surgery it may be brought to your room.  For patients admitted to the hospital, discharge time will be determined by your treatment team.  Patients discharged the day of surgery will not be allowed to drive home.   Please read over the following fact sheets that you were given.   [facility]- Preparing For Surgery  Before surgery, you can play an important role. Because skin is not sterile, your skin needs to be as free of germs as possible. You can reduce the number of germs on your skin by washing with CHG (chlorahexidine gluconate) Soap before  surgery.  CHG is an antiseptic cleaner which kills germs and bonds with the skin to continue killing germs even after washing.  Please do not use if you have an allergy to CHG or antibacterial soaps. If your skin becomes reddened/irritated stop using the CHG.  Do not shave (including legs and underarms) for at least 48 hours prior to first CHG shower. It is OK to shave your face.  Please follow these instructions carefully.   1. Shower the NIGHT BEFORE SURGERY and the MORNING OF SURGERY with CHG.   2. If you chose to wash your hair, wash your hair first as usual with your normal shampoo.  3. After you shampoo, rinse your hair and body thoroughly to remove the shampoo.  4. Use CHG as you would any other liquid soap. You can apply CHG directly to the skin and wash gently with a scrungie or a clean washcloth.   5. Apply the CHG Soap to your body ONLY FROM THE NECK DOWN.  Do not use on open wounds or open sores. Avoid contact with your eyes, ears, mouth and genitals (private parts). Wash genitals (private parts) with your normal soap.  6. Wash thoroughly, paying special attention to the area where your surgery will be performed.  7. Thoroughly rinse your body with warm water from the neck down.  8. DO NOT shower/wash with your normal soap after using and rinsing off the CHG Soap.  9. Pat yourself dry with a CLEAN TOWEL.   10. Wear CLEAN PAJAMAS   11. Place CLEAN SHEETS on  your bed the night of your first shower and DO NOT SLEEP WITH PETS.    Day of Surgery: Do not apply any deodorants/lotions. Please wear clean clothes to the hospital/surgery center.

## 2015-07-21 ENCOUNTER — Encounter (HOSPITAL_COMMUNITY): Payer: Self-pay

## 2015-08-19 NOTE — Progress Notes (Signed)
I spoke with Ms Prisbrey, who said that no one told her that she was having surgery tomorrow.  "the last I heard was that I had to get my blood pressure under 200."  I asked patient what her readings have been this week , "around 160, a few bottom numbers have been 102."  Patient could not get to paper of blood pressure readings, but reported that the last time it was 200 was sometime last week. "But no one told me I was having surgery tomorrow."  I left a message on Jessica's voice mail at Dr Maryjean Ka' office.

## 2015-08-19 NOTE — Progress Notes (Deleted)
I spoke with Tamara Howe, patient's mother and instructed her to not give patient any more Advil, until instructed by surgeon.

## 2015-08-20 ENCOUNTER — Ambulatory Visit (HOSPITAL_COMMUNITY): Admission: RE | Admit: 2015-08-20 | Payer: Medicare Other | Source: Ambulatory Visit | Admitting: Anesthesiology

## 2015-08-20 ENCOUNTER — Encounter (HOSPITAL_COMMUNITY): Admission: RE | Payer: Self-pay | Source: Ambulatory Visit

## 2015-08-20 SURGERY — INSERTION, SPINAL CORD STIMULATOR, LUMBAR
Anesthesia: Monitor Anesthesia Care

## 2016-03-06 DIAGNOSIS — M545 Low back pain, unspecified: Secondary | ICD-10-CM | POA: Insufficient documentation

## 2016-03-06 DIAGNOSIS — G8929 Other chronic pain: Secondary | ICD-10-CM | POA: Insufficient documentation

## 2016-03-06 DIAGNOSIS — I2510 Atherosclerotic heart disease of native coronary artery without angina pectoris: Secondary | ICD-10-CM | POA: Insufficient documentation

## 2017-02-06 ENCOUNTER — Inpatient Hospital Stay (HOSPITAL_COMMUNITY)
Admission: EM | Admit: 2017-02-06 | Discharge: 2017-02-11 | DRG: 872 | Disposition: A | Discharge status: Home Health | Payer: Medicare Other | Attending: Infectious Disease | Admitting: Infectious Disease

## 2017-02-06 ENCOUNTER — Other Ambulatory Visit: Payer: Self-pay

## 2017-02-06 ENCOUNTER — Encounter (HOSPITAL_COMMUNITY): Payer: Self-pay | Admitting: *Deleted

## 2017-02-06 ENCOUNTER — Emergency Department (HOSPITAL_COMMUNITY): Payer: Medicare Other

## 2017-02-06 DIAGNOSIS — G8929 Other chronic pain: Secondary | ICD-10-CM | POA: Diagnosis present

## 2017-02-06 DIAGNOSIS — Z91041 Radiographic dye allergy status: Secondary | ICD-10-CM

## 2017-02-06 DIAGNOSIS — Z90710 Acquired absence of both cervix and uterus: Secondary | ICD-10-CM

## 2017-02-06 DIAGNOSIS — E7800 Pure hypercholesterolemia, unspecified: Secondary | ICD-10-CM | POA: Diagnosis present

## 2017-02-06 DIAGNOSIS — L03115 Cellulitis of right lower limb: Secondary | ICD-10-CM

## 2017-02-06 DIAGNOSIS — Z91013 Allergy to seafood: Secondary | ICD-10-CM

## 2017-02-06 DIAGNOSIS — A419 Sepsis, unspecified organism: Principal | ICD-10-CM | POA: Diagnosis present

## 2017-02-06 DIAGNOSIS — L02415 Cutaneous abscess of right lower limb: Secondary | ICD-10-CM

## 2017-02-06 DIAGNOSIS — Z91040 Latex allergy status: Secondary | ICD-10-CM

## 2017-02-06 DIAGNOSIS — K219 Gastro-esophageal reflux disease without esophagitis: Secondary | ICD-10-CM | POA: Diagnosis present

## 2017-02-06 DIAGNOSIS — Z880 Allergy status to penicillin: Secondary | ICD-10-CM

## 2017-02-06 DIAGNOSIS — I10 Essential (primary) hypertension: Secondary | ICD-10-CM | POA: Diagnosis present

## 2017-02-06 DIAGNOSIS — L03116 Cellulitis of left lower limb: Secondary | ICD-10-CM | POA: Diagnosis present

## 2017-02-06 DIAGNOSIS — Z885 Allergy status to narcotic agent status: Secondary | ICD-10-CM

## 2017-02-06 DIAGNOSIS — D649 Anemia, unspecified: Secondary | ICD-10-CM | POA: Diagnosis present

## 2017-02-06 DIAGNOSIS — Z8249 Family history of ischemic heart disease and other diseases of the circulatory system: Secondary | ICD-10-CM

## 2017-02-06 DIAGNOSIS — Z886 Allergy status to analgesic agent status: Secondary | ICD-10-CM

## 2017-02-06 DIAGNOSIS — Z91010 Allergy to peanuts: Secondary | ICD-10-CM

## 2017-02-06 DIAGNOSIS — Z882 Allergy status to sulfonamides status: Secondary | ICD-10-CM

## 2017-02-06 DIAGNOSIS — Z8673 Personal history of transient ischemic attack (TIA), and cerebral infarction without residual deficits: Secondary | ICD-10-CM

## 2017-02-06 DIAGNOSIS — Z833 Family history of diabetes mellitus: Secondary | ICD-10-CM

## 2017-02-06 DIAGNOSIS — B9561 Methicillin susceptible Staphylococcus aureus infection as the cause of diseases classified elsewhere: Secondary | ICD-10-CM | POA: Diagnosis present

## 2017-02-06 DIAGNOSIS — M25561 Pain in right knee: Secondary | ICD-10-CM | POA: Diagnosis not present

## 2017-02-06 HISTORY — DX: Cellulitis of unspecified part of limb: L03.119

## 2017-02-06 HISTORY — DX: Cutaneous abscess of limb, unspecified: L02.419

## 2017-02-06 NOTE — ED Notes (Signed)
Patient up to desk inquiring about the wait time. Apologized for patient's wait and advised her of place in queue.

## 2017-02-06 NOTE — ED Triage Notes (Signed)
The pt had  Her knee injected dec 21st  The rt knee has been draining since the injection on the 21st she had this done by dr Maryjean Ka  She called the office and they told her to come to the ed

## 2017-02-07 ENCOUNTER — Inpatient Hospital Stay (HOSPITAL_COMMUNITY): Payer: Medicare Other

## 2017-02-07 ENCOUNTER — Encounter (HOSPITAL_COMMUNITY): Payer: Self-pay | Admitting: Internal Medicine

## 2017-02-07 DIAGNOSIS — M25561 Pain in right knee: Secondary | ICD-10-CM | POA: Diagnosis present

## 2017-02-07 DIAGNOSIS — Z8249 Family history of ischemic heart disease and other diseases of the circulatory system: Secondary | ICD-10-CM | POA: Diagnosis not present

## 2017-02-07 DIAGNOSIS — D649 Anemia, unspecified: Secondary | ICD-10-CM

## 2017-02-07 DIAGNOSIS — A419 Sepsis, unspecified organism: Secondary | ICD-10-CM | POA: Diagnosis present

## 2017-02-07 DIAGNOSIS — L03116 Cellulitis of left lower limb: Secondary | ICD-10-CM | POA: Diagnosis present

## 2017-02-07 DIAGNOSIS — I10 Essential (primary) hypertension: Secondary | ICD-10-CM | POA: Diagnosis not present

## 2017-02-07 DIAGNOSIS — Z91041 Radiographic dye allergy status: Secondary | ICD-10-CM | POA: Diagnosis not present

## 2017-02-07 DIAGNOSIS — Z880 Allergy status to penicillin: Secondary | ICD-10-CM | POA: Diagnosis not present

## 2017-02-07 DIAGNOSIS — Z882 Allergy status to sulfonamides status: Secondary | ICD-10-CM | POA: Diagnosis not present

## 2017-02-07 DIAGNOSIS — B9561 Methicillin susceptible Staphylococcus aureus infection as the cause of diseases classified elsewhere: Secondary | ICD-10-CM | POA: Diagnosis present

## 2017-02-07 DIAGNOSIS — L02415 Cutaneous abscess of right lower limb: Secondary | ICD-10-CM | POA: Diagnosis present

## 2017-02-07 DIAGNOSIS — Z91013 Allergy to seafood: Secondary | ICD-10-CM | POA: Diagnosis not present

## 2017-02-07 DIAGNOSIS — K219 Gastro-esophageal reflux disease without esophagitis: Secondary | ICD-10-CM | POA: Diagnosis present

## 2017-02-07 DIAGNOSIS — E7800 Pure hypercholesterolemia, unspecified: Secondary | ICD-10-CM | POA: Diagnosis present

## 2017-02-07 DIAGNOSIS — Z91010 Allergy to peanuts: Secondary | ICD-10-CM | POA: Diagnosis not present

## 2017-02-07 DIAGNOSIS — G8929 Other chronic pain: Secondary | ICD-10-CM | POA: Diagnosis present

## 2017-02-07 DIAGNOSIS — Z886 Allergy status to analgesic agent status: Secondary | ICD-10-CM | POA: Diagnosis not present

## 2017-02-07 DIAGNOSIS — L03115 Cellulitis of right lower limb: Secondary | ICD-10-CM | POA: Diagnosis present

## 2017-02-07 DIAGNOSIS — Z885 Allergy status to narcotic agent status: Secondary | ICD-10-CM | POA: Diagnosis not present

## 2017-02-07 DIAGNOSIS — Z91040 Latex allergy status: Secondary | ICD-10-CM | POA: Diagnosis not present

## 2017-02-07 DIAGNOSIS — Z8673 Personal history of transient ischemic attack (TIA), and cerebral infarction without residual deficits: Secondary | ICD-10-CM | POA: Diagnosis not present

## 2017-02-07 DIAGNOSIS — Z90710 Acquired absence of both cervix and uterus: Secondary | ICD-10-CM | POA: Diagnosis not present

## 2017-02-07 DIAGNOSIS — Z833 Family history of diabetes mellitus: Secondary | ICD-10-CM | POA: Diagnosis not present

## 2017-02-07 LAB — BASIC METABOLIC PANEL
ANION GAP: 9 (ref 5–15)
ANION GAP: 8 (ref 5–15)
BUN: 24 mg/dL — ABNORMAL HIGH (ref 6–20)
BUN: 22 mg/dL — ABNORMAL HIGH (ref 6–20)
CALCIUM: 9.1 mg/dL (ref 8.9–10.3)
CALCIUM: 9 mg/dL (ref 8.9–10.3)
CHLORIDE: 103 mmol/L (ref 101–111)
CO2: 23 mmol/L (ref 22–32)
CO2: 22 mmol/L (ref 22–32)
Chloride: 101 mmol/L (ref 101–111)
Creatinine, Ser: 1.14 mg/dL — ABNORMAL HIGH (ref 0.44–1.00)
Creatinine, Ser: 1.02 mg/dL — ABNORMAL HIGH (ref 0.44–1.00)
GFR calc non Af Amer: 48 mL/min — ABNORMAL LOW (ref >60)
GFR calc non Af Amer: 54 mL/min — ABNORMAL LOW (ref >60)
GFR, EST AFRICAN AMERICAN: 55 mL/min — AB (ref >60)
GLUCOSE: 126 mg/dL — AB (ref 65–99)
Glucose, Bld: 138 mg/dL — ABNORMAL HIGH (ref 65–99)
POTASSIUM: 3.9 mmol/L (ref 3.5–5.1)
Potassium: 4.3 mmol/L (ref 3.5–5.1)
Sodium: 133 mmol/L — ABNORMAL LOW (ref 135–145)
Sodium: 133 mmol/L — ABNORMAL LOW (ref 135–145)

## 2017-02-07 LAB — CBC WITH DIFFERENTIAL/PLATELET
BASOS PCT: 0 %
Basophils Absolute: 0 10*3/uL (ref 0.0–0.1)
Eosinophils Absolute: 0.1 10*3/uL (ref 0.0–0.7)
Eosinophils Relative: 1 %
HEMATOCRIT: 35 % — AB (ref 36.0–46.0)
HEMOGLOBIN: 10.9 g/dL — AB (ref 12.0–15.0)
LYMPHS ABS: 1.8 10*3/uL (ref 0.7–4.0)
Lymphocytes Relative: 8 %
MCH: 24.1 pg — AB (ref 26.0–34.0)
MCHC: 31.1 g/dL (ref 30.0–36.0)
MCV: 77.3 fL — AB (ref 78.0–100.0)
MONO ABS: 1.5 10*3/uL — AB (ref 0.1–1.0)
MONOS PCT: 7 %
NEUTROS ABS: 18.1 10*3/uL — AB (ref 1.7–7.7)
NEUTROS PCT: 84 %
Platelets: 374 10*3/uL (ref 150–400)
RBC: 4.53 MIL/uL (ref 3.87–5.11)
RDW: 16 % — AB (ref 11.5–15.5)
WBC: 21.5 10*3/uL — ABNORMAL HIGH (ref 4.0–10.5)

## 2017-02-07 LAB — CBC
HEMATOCRIT: 35.3 % — AB (ref 36.0–46.0)
HEMOGLOBIN: 10.8 g/dL — AB (ref 12.0–15.0)
MCH: 24.1 pg — AB (ref 26.0–34.0)
MCHC: 30.6 g/dL (ref 30.0–36.0)
MCV: 78.8 fL (ref 78.0–100.0)
Platelets: 344 10*3/uL (ref 150–400)
RBC: 4.48 MIL/uL (ref 3.87–5.11)
RDW: 16 % — ABNORMAL HIGH (ref 11.5–15.5)
WBC: 19.9 10*3/uL — ABNORMAL HIGH (ref 4.0–10.5)

## 2017-02-07 LAB — TYPE AND SCREEN
ABO/RH(D): B POS
ANTIBODY SCREEN: NEGATIVE

## 2017-02-07 LAB — LACTIC ACID, PLASMA
LACTIC ACID, VENOUS: 1.4 mmol/L (ref 0.5–1.9)
Lactic Acid, Venous: 1.6 mmol/L (ref 0.5–1.9)

## 2017-02-07 LAB — PROCALCITONIN: PROCALCITONIN: 0.39 ng/mL

## 2017-02-07 LAB — ABO/RH: ABO/RH(D): B POS

## 2017-02-07 LAB — SEDIMENTATION RATE: Sed Rate: 42 mm/hr — ABNORMAL HIGH (ref 0–22)

## 2017-02-07 MED ORDER — ACETAMINOPHEN 325 MG PO TABS
650.0000 mg | ORAL_TABLET | Freq: Four times a day (QID) | ORAL | Status: DC | PRN
  Administered 2017-02-07 – 2017-02-11 (×3): 650 mg via ORAL
  Filled 2017-02-07 (×2): qty 2

## 2017-02-07 MED ORDER — DEXTROSE 5 % IV SOLN
1.0000 g | Freq: Three times a day (TID) | INTRAVENOUS | Status: DC
  Administered 2017-02-07 (×3): 1 g via INTRAVENOUS
  Filled 2017-02-07 (×7): qty 1

## 2017-02-07 MED ORDER — MELATONIN 3 MG PO TABS
3.0000 mg | ORAL_TABLET | Freq: Every day | ORAL | Status: DC
  Administered 2017-02-07 – 2017-02-10 (×4): 3 mg via ORAL
  Filled 2017-02-07 (×6): qty 1

## 2017-02-07 MED ORDER — SODIUM CHLORIDE 0.9 % IV SOLN
INTRAVENOUS | Status: AC
  Administered 2017-02-07 (×2): via INTRAVENOUS

## 2017-02-07 MED ORDER — VANCOMYCIN HCL IN DEXTROSE 1-5 GM/200ML-% IV SOLN
1000.0000 mg | Freq: Once | INTRAVENOUS | Status: AC
  Administered 2017-02-07: 1000 mg via INTRAVENOUS
  Filled 2017-02-07: qty 200

## 2017-02-07 MED ORDER — DIVALPROEX SODIUM ER 500 MG PO TB24
500.0000 mg | ORAL_TABLET | Freq: Every day | ORAL | Status: DC
  Administered 2017-02-07 – 2017-02-11 (×5): 500 mg via ORAL
  Filled 2017-02-07 (×6): qty 1

## 2017-02-07 MED ORDER — LIDOCAINE HCL (PF) 1 % IJ SOLN
10.0000 mL | Freq: Once | INTRAMUSCULAR | Status: AC
  Administered 2017-02-07: 10 mL via INTRADERMAL
  Filled 2017-02-07: qty 10

## 2017-02-07 MED ORDER — VITAMIN D (ERGOCALCIFEROL) 1.25 MG (50000 UNIT) PO CAPS
50000.0000 [IU] | ORAL_CAPSULE | ORAL | Status: DC
  Administered 2017-02-07 – 2017-02-09 (×2): 50000 [IU] via ORAL
  Filled 2017-02-07 (×2): qty 1

## 2017-02-07 MED ORDER — CLINDAMYCIN PHOSPHATE 600 MG/50ML IV SOLN
600.0000 mg | Freq: Once | INTRAVENOUS | Status: AC
  Administered 2017-02-07: 600 mg via INTRAVENOUS
  Filled 2017-02-07: qty 50

## 2017-02-07 MED ORDER — ALBUTEROL SULFATE (2.5 MG/3ML) 0.083% IN NEBU: 2.5000 mg | INHALATION_SOLUTION | Freq: Four times a day (QID) | RESPIRATORY_TRACT | Status: DC | PRN

## 2017-02-07 MED ORDER — CALCIUM CARBONATE ANTACID 500 MG PO CHEW
1.0000 | CHEWABLE_TABLET | Freq: Every day | ORAL | Status: DC
  Administered 2017-02-08 – 2017-02-11 (×4): 200 mg via ORAL
  Filled 2017-02-07 (×5): qty 1

## 2017-02-07 MED ORDER — ISOSORBIDE DINITRATE 10 MG PO TABS
30.0000 mg | ORAL_TABLET | Freq: Every day | ORAL | Status: DC
  Administered 2017-02-07 – 2017-02-10 (×4): 30 mg via ORAL
  Filled 2017-02-07: qty 3
  Filled 2017-02-07: qty 1
  Filled 2017-02-07 (×3): qty 3

## 2017-02-07 MED ORDER — PANTOPRAZOLE SODIUM 40 MG PO TBEC
40.0000 mg | DELAYED_RELEASE_TABLET | Freq: Every day | ORAL | Status: DC
  Administered 2017-02-07 – 2017-02-11 (×5): 40 mg via ORAL
  Filled 2017-02-07 (×6): qty 1

## 2017-02-07 MED ORDER — HYDROCODONE-ACETAMINOPHEN 10-325 MG PO TABS: 1.0000 | ORAL_TABLET | Freq: Four times a day (QID) | ORAL | Status: DC | PRN

## 2017-02-07 MED ORDER — TRAZODONE HCL 50 MG PO TABS
50.0000 mg | ORAL_TABLET | Freq: Every day | ORAL | Status: DC
  Administered 2017-02-07 – 2017-02-10 (×4): 50 mg via ORAL
  Filled 2017-02-07 (×4): qty 1

## 2017-02-07 MED ORDER — ONDANSETRON HCL 4 MG/2ML IJ SOLN: 4.0000 mg | Freq: Four times a day (QID) | INTRAMUSCULAR | Status: DC | PRN

## 2017-02-07 MED ORDER — METOPROLOL TARTRATE 100 MG PO TABS
100.0000 mg | ORAL_TABLET | Freq: Two times a day (BID) | ORAL | Status: DC
  Administered 2017-02-07 – 2017-02-09 (×5): 100 mg via ORAL
  Filled 2017-02-07: qty 4
  Filled 2017-02-07 (×4): qty 1

## 2017-02-07 MED ORDER — FENTANYL CITRATE (PF) 100 MCG/2ML IJ SOLN
25.0000 ug | INTRAMUSCULAR | Status: DC | PRN
  Administered 2017-02-07 – 2017-02-10 (×6): 25 ug via INTRAVENOUS
  Filled 2017-02-07 (×8): qty 2

## 2017-02-07 MED ORDER — CLONAZEPAM 0.5 MG PO TABS
0.5000 mg | ORAL_TABLET | Freq: Two times a day (BID) | ORAL | Status: DC | PRN
  Administered 2017-02-09: 0.5 mg via ORAL
  Filled 2017-02-07: qty 1

## 2017-02-07 MED ORDER — FENTANYL CITRATE (PF) 100 MCG/2ML IJ SOLN
100.0000 ug | Freq: Once | INTRAMUSCULAR | Status: AC
  Administered 2017-02-07: 100 ug via INTRAVENOUS
  Filled 2017-02-07: qty 2

## 2017-02-07 MED ORDER — ACETAMINOPHEN 650 MG RE SUPP: 650.0000 mg | Freq: Four times a day (QID) | RECTAL | Status: DC | PRN

## 2017-02-07 MED ORDER — NIFEDIPINE ER 60 MG PO TB24
60.0000 mg | ORAL_TABLET | Freq: Every day | ORAL | Status: DC
  Administered 2017-02-07 – 2017-02-11 (×5): 60 mg via ORAL
  Filled 2017-02-07 (×5): qty 1

## 2017-02-07 MED ORDER — ONDANSETRON HCL 4 MG PO TABS: 4.0000 mg | ORAL_TABLET | Freq: Four times a day (QID) | ORAL | Status: DC | PRN

## 2017-02-07 MED ORDER — FENTANYL CITRATE (PF) 100 MCG/2ML IJ SOLN
50.0000 ug | Freq: Once | INTRAMUSCULAR | Status: AC
  Administered 2017-02-07: 50 ug via INTRAVENOUS
  Filled 2017-02-07: qty 2

## 2017-02-07 MED ORDER — VANCOMYCIN HCL IN DEXTROSE 750-5 MG/150ML-% IV SOLN
750.0000 mg | INTRAVENOUS | Status: DC
  Administered 2017-02-08 – 2017-02-09 (×2): 750 mg via INTRAVENOUS
  Filled 2017-02-07 (×2): qty 150

## 2017-02-07 MED ORDER — DIPHENHYDRAMINE HCL 25 MG PO CAPS: 25.0000 mg | ORAL_CAPSULE | Freq: Four times a day (QID) | ORAL | Status: DC | PRN

## 2017-02-07 MED ORDER — FERROUS SULFATE 325 (65 FE) MG PO TABS
325.0000 mg | ORAL_TABLET | Freq: Every day | ORAL | Status: DC
  Administered 2017-02-07 – 2017-02-11 (×5): 325 mg via ORAL
  Filled 2017-02-07 (×6): qty 1

## 2017-02-07 NOTE — ED Notes (Signed)
Ortho at the bedside.

## 2017-02-07 NOTE — ED Notes (Signed)
Ordered breakfast tray  

## 2017-02-07 NOTE — ED Provider Notes (Signed)
[facility] EMERGENCY DEPARTMENT Provider Note   CSN: [id number] Arrival date & time: 02/06/17  1824     History   Chief Complaint Chief Complaint  Patient presents with  . Wound Check    HPI Tamara Howe is a 72 y.o. female past medical history of osteoarthritis, pain, GERD who presents for evaluation of right lower extremity pain, redness and swelling.  Patient also reports drainage from the right anterior aspect of the leg.  Patient reports that on 01/26/17, she had a injection done at her chronic pain doctors office.  She states that they made several injections to help with the pain.  Patient has known history of osteoarthritis of that knee but has not a surgical candidate at this time.  Patient reports that over the last week, she has noticed worsening pain, redness and swelling to the area.  Patient reports that over the last few days, she has noticed a small bump to the anterior aspect of the right leg at that has started draining.  Patient reports that she has been taking her previously prescribed Vicodin for the pain with minimal improvement.  Patient denies any fevers, chest pain, difficulty breathing, abdominal pain, vomiting.  The history is provided by the patient.    Past Medical History:  Diagnosis Date  . Abdominal mass, LLQ (left lower quadrant)   . Anemia   . Anxiety   . Asthma   . Bladder infection, chronic   . Chest pain   . Chronic pain   . Complication of anesthesia    "stopped breathing with tumor removal surgery, not happened since then"  . Constipation   . Cough   . Depression   . Dietary counseling   . Dysuria   . Edema extremities   . Enthesopathy of knee   . Esophagitis   . GERD (gastroesophageal reflux disease)   . Headache   . Heart murmur   . High risk medication use   . History of bronchitis   . Hypercholesterolemia   . Hypertension   . Insomnia   . Ischemic cerebrovascular accident (CVA) of frontal lobe ([place])   .  Lipoma of skin   . Low back pain   . Malaise and fatigue   . Nocturia   . Noncompliance with medications   . Osteoarthritis   . PONV (postoperative nausea and vomiting)   . Prediabetes   . Rash   . Shortness of breath dyspnea   . Vitamin D deficiency     Patient Active Problem List   Diagnosis Date Noted  . Sepsis ([place]) 02/07/2017  . Cellulitis of left lower extremity 02/07/2017  . Normocytic normochromic anemia 02/07/2017  . Essential hypertension 02/07/2017    Past Surgical History:  Procedure Laterality Date  . ABDOMINAL HYSTERECTOMY    . ANKLE SURGERY Right   . APPENDECTOMY    . CARDIAC CATHETERIZATION     12/13/12 [facility], Dr. Gibson Ramp): 30-40% mid LAD.  Marland Kitchen SALIVARY GLAND SURGERY    . TUMOR EXCISION      OB History    No data available       Home Medications    Prior to Admission medications   Medication Sig Start Date End Date Taking? Authorizing Provider  acetaminophen (TYLENOL) 500 MG tablet Take 1,000 mg by mouth every 6 (six) hours as needed for moderate pain.    [provider]  albuterol (PROVENTIL HFA) 108 (90 Base) MCG/ACT inhaler Inhale 2 puffs into the lungs every 6 (  six) hours as needed for wheezing or shortness of breath.    [provider]  albuterol (PROVENTIL) (2.5 MG/3ML) 0.083% nebulizer solution Take 2.5 mg by nebulization every 6 (six) hours as needed for wheezing or shortness of breath.    [provider]  calcium carbonate (TUMS - DOSED IN MG ELEMENTAL CALCIUM) 500 MG chewable tablet Chew 1 tablet by mouth daily.    [provider]  chlorthalidone (HYGROTON) 25 MG tablet Take 25 mg by mouth daily.    [provider]  Cholecalciferol (VITAMIN D3) 3000 units TABS Take 1 tablet by mouth daily.    [provider]  clonazePAM (KLONOPIN) 0.5 MG tablet Take 0.5 mg by mouth 2 (two) times daily as needed for anxiety.    [provider]  diphenhydrAMINE (BENADRYL) 25 mg capsule Take 25 mg by  mouth every 6 (six) hours as needed for allergies.    [provider]  divalproex (DEPAKOTE ER) 500 MG 24 hr tablet Take 500 mg by mouth daily.    [provider]  EPINEPHrine (EPIPEN 2-PAK) 0.3 mg/0.3 mL IJ SOAJ injection Inject 0.3 mg into the muscle once. For anaphylaxis    [provider]  ferrous sulfate 325 (65 FE) MG tablet Take 325 mg by mouth daily with breakfast.    [provider]  furosemide (LASIX) 40 MG tablet Take 40 mg by mouth daily as needed for fluid or edema.    [provider]  HYDROcodone-acetaminophen (NORCO) 10-325 MG tablet Take 1 tablet by mouth every 6 (six) hours as needed for moderate pain.     [provider]  isosorbide dinitrate (ISORDIL) 30 MG tablet Take 30 mg by mouth at bedtime.    [provider]  lansoprazole (PREVACID) 30 MG capsule Take 30 mg by mouth daily as needed (for heartburn).     [provider]  Melatonin 3 MG TABS Take 3 mg by mouth at bedtime.    [provider]  metoprolol (LOPRESSOR) 100 MG tablet Take 100 mg by mouth 2 (two) times daily.    [provider]  NIFEdipine (PROCARDIA-XL/ADALAT CC) 60 MG 24 hr tablet Take 60 mg by mouth daily.    [provider]  potassium chloride SA (K-DUR,KLOR-CON) 20 MEQ tablet Take 20 mEq by mouth daily.    [provider]  traZODone (DESYREL) 50 MG tablet Take 50 mg by mouth at bedtime.    [provider]  Vitamin D, Ergocalciferol, (DRISDOL) 50000 units CAPS capsule Take 50,000 Units by mouth every Monday, Wednesday, and Friday.     [provider]    Family History Family History  Problem Relation Age of Onset  . Hypertension Other     Social History Social History   Tobacco Use  . Smoking status: Never Smoker  . Smokeless tobacco: Never Used  Substance Use Topics  . Alcohol use: No  . Drug use: No     Allergies   Aspirin; Codeine; Peanut (diagnostic); Penicillins;  Shellfish allergy; Tetracycline; Cephalexin; Iodinated diagnostic agents; Latex; Morphine; Nitrofuran derivatives; and Sulfa antibiotics   Review of Systems Review of Systems  Constitutional: Negative for fever.  Respiratory: Negative for shortness of breath.   Cardiovascular: Positive for leg swelling. Negative for chest pain.  Skin: Positive for color change and wound.     Physical Exam Updated Vital Signs BP (!) 149/75   Pulse (!) 58   Temp 98.7 F (37.1 C) (Oral)   Resp 18  Ht 5\' 1"  (1.549 m)   Wt 55.3 kg (122 lb)   SpO2 95%   BMI 23.05 kg/m   Physical Exam  Constitutional: She is oriented to person, place, and time. She appears well-developed and well-nourished.  HENT:  Head: Normocephalic and atraumatic.  Mouth/Throat: Oropharynx is clear and moist and mucous membranes are normal.  Eyes: Conjunctivae, EOM and lids are normal. Pupils are equal, round, and reactive to light.  Neck: Full passive range of motion without pain.  Cardiovascular: Normal rate, regular rhythm, normal heart sounds and normal pulses. Exam reveals no gallop and no friction rub.  No murmur heard. Pulses:      Dorsalis pedis pulses are 2+ on the right side, and 2+ on the left side.  Pulmonary/Chest: Effort normal and breath sounds normal.  Abdominal: Soft. Normal appearance. There is no tenderness. There is no rigidity and no guarding.  Musculoskeletal: Normal range of motion.  Tenderness palpation to the anterior aspect of the right knee that extends down to the anterior aspect of the knee. Flexion/extension of knee intact.  There is overlying warmth.  Patient has a area that is 2 cm in diameter of fluctuance with active drainage. No  Tenderness palpation to the right ankle, foot.  Neurological: She is alert and oriented to person, place, and time.  Skin: Skin is warm and dry. Capillary refill takes less than 2 seconds.  Psychiatric: She has a normal mood and affect. Her speech is normal.  Nursing  note and vitals reviewed.        ED Treatments / Results  Labs (all labs ordered are listed, but only abnormal results are displayed) Labs Reviewed  BASIC METABOLIC PANEL - Abnormal; Notable for the following components:      Result Value   Sodium 133 (*)    Glucose, Bld 126 (*)    BUN 24 (*)    Creatinine, Ser 1.14 (*)    GFR calc non Af Amer 48 (*)    GFR calc Af Amer 55 (*)    All other components within normal limits  CBC WITH DIFFERENTIAL/PLATELET - Abnormal; Notable for the following components:   WBC 21.5 (*)    Hemoglobin 10.9 (*)    HCT 35.0 (*)    MCV 77.3 (*)    MCH 24.1 (*)    RDW 16.0 (*)    Neutro Abs 18.1 (*)    Monocytes Absolute 1.5 (*)    All other components within normal limits  AEROBIC CULTURE (SUPERFICIAL SPECIMEN)  BASIC METABOLIC PANEL  CBC  LACTIC ACID, PLASMA  LACTIC ACID, PLASMA  TYPE AND SCREEN    EKG  EKG Interpretation None       Radiology Dg Knee Complete 4 Views Right  Result Date: 02/06/2017 CLINICAL DATA:  Patient had a right knee injection December 21st now with pain and drainage. EXAM: RIGHT KNEE - COMPLETE 4+ VIEW COMPARISON:  September 24, 2014 FINDINGS: No evidence of fracture, dislocation. There is a small suprapatellar effusion. Severe osteoarthritic changes of the right knee are noted. Soft tissues are unremarkable. IMPRESSION: No acute abnormality.  Severe osteoarthritic changes of right knee. Electronically Signed   By: Abelardo Diesel M.D.   On: 02/06/2017 20:07    Procedures .Marland KitchenIncision and Drainage Date/Time: 02/07/2017 3:53 AM Performed by: Volanda Napoleon, PA-C Authorized by: Volanda Napoleon, PA-C   Consent:    Consent obtained:  Verbal   Consent given by:  Patient   Risks discussed:  Bleeding and infection  Location:    Type:  Abscess   Location:  Lower extremity   Lower extremity location:  Leg   Leg location:  R lower leg Pre-procedure details:    Skin preparation:  Betadine Anesthesia (see MAR for  exact dosages):    Anesthesia method:  Local infiltration   Local anesthetic:  Lidocaine 1% w/o epi Procedure type:    Complexity:  Simple Procedure details:    Incision types:  Single straight   Scalpel blade:  11   Wound management:  Extensive cleaning   Drainage:  Purulent   Drainage amount:  Moderate   Wound treatment:  Wound left open Post-procedure details:    Patient tolerance of procedure:  Tolerated well, no immediate complications   (including critical care time)  Medications Ordered in ED Medications  albuterol (PROVENTIL) (2.5 MG/3ML) 0.083% nebulizer solution 2.5 mg (not administered)  calcium carbonate (TUMS - dosed in mg elemental calcium) chewable tablet 200 mg of elemental calcium (not administered)  clonazePAM (KLONOPIN) tablet 0.5 mg (not administered)  diphenhydrAMINE (BENADRYL) capsule 25 mg (not administered)  divalproex (DEPAKOTE ER) 24 hr tablet 500 mg (not administered)  ferrous sulfate tablet 325 mg (not administered)  HYDROcodone-acetaminophen (NORCO) 10-325 MG per tablet 1 tablet (not administered)  isosorbide dinitrate (ISORDIL) tablet 30 mg (not administered)  pantoprazole (PROTONIX) EC tablet 40 mg (not administered)  Melatonin TABS 3 mg (not administered)  metoprolol tartrate (LOPRESSOR) tablet 100 mg (not administered)  NIFEdipine (PROCARDIA-XL/ADALAT CC) 24 hr tablet 60 mg (not administered)  traZODone (DESYREL) tablet 50 mg (not administered)  Vitamin D (Ergocalciferol) (DRISDOL) capsule 50,000 Units (not administered)  acetaminophen (TYLENOL) tablet 650 mg (not administered)    Or  acetaminophen (TYLENOL) suppository 650 mg (not administered)  ondansetron (ZOFRAN) tablet 4 mg (not administered)    Or  ondansetron (ZOFRAN) injection 4 mg (not administered)  vancomycin (VANCOCIN) IVPB 1000 mg/200 mL premix (1,000 mg Intravenous New Bag/Given 02/07/17 0411)  0.9 %  sodium chloride infusion ( Intravenous New Bag/Given 02/07/17 0411)  fentaNYL  (SUBLIMAZE) injection 50 mcg (not administered)  fentaNYL (SUBLIMAZE) injection 100 mcg (100 mcg Intravenous Given 02/07/17 0135)  clindamycin (CLEOCIN) IVPB 600 mg (0 mg Intravenous Stopped 02/07/17 0329)  lidocaine (PF) (XYLOCAINE) 1 % injection 10 mL (10 mLs Intradermal Given 02/07/17 0253)     Initial Impression / Assessment and Plan / ED Course  I have reviewed the triage vital signs and the nursing notes.  Pertinent labs & imaging results that were available during my care of the patient were reviewed by me and considered in my medical decision making (see chart for details).     72 y.o. F with PMH/o Chronic pain, osteoarthritis who presents for evaluation of right knee and leg pain, swelling, redness.  Patient also notes over the last couple days, she has had drainage from the site.  Patient reports that she was seen by chronic pain doctor on 01/26/17 for what sounds like steroid injection.  Patient reports that since then, she has experienced pain, redness and swelling to the right knee.  Patient reports 2 days ago she was demonstrated.  No fevers, chest pain, difficulty breathing.  Patient is afebrile but tachycardic and hypertensive.  Will treat pain and reassess.  Consider cellulitis. Though septic arthritis is a consideration given history of injections and symptoms, patient's distribution of warmth and erythema are below the joint line. History/physical exam not concerning for DVT. Analgesics provided in the department.  Labs reviewed. Leukocytosis of 21.5.  BMP  shows BUN and creatinine at 24 and 1.14.  Given concerns about cellulitis with extensive leukocytosis, concern that the best course of action would be to admit.  And to start IV antibiotics here in the department.  Discussed with form.  Given patient extensive allergies, options would be clindamycin or vancomycin.  Patient does not want to be admitted at this time.  Will plan to start on clindamycin.   ID as documented above.  There  was purulent drainage from the fluctuant area.  Wound culture obtained.  Discussed with patient regarding findings on lab work and physical exam.  Explained concern for worsening cellulitis.  Patient agrees for admission at this time.  Plan to consult hospitalist.  Discussed patient with Dr. Cyndia Diver (hospitalist). Will plan to admit. Would like CT knee and leg ordered for evaluation.    Final Clinical Impressions(s) / ED Diagnoses   Final diagnoses:  Cellulitis of left lower extremity    ED Discharge Orders    None       Volanda Napoleon, PA-C 02/07/17 0500    Ward, Delice Bison, DO 02/07/17 0503    Ward, Delice Bison, DO 02/07/17 [phone]

## 2017-02-07 NOTE — ED Notes (Signed)
PHlebotomy at the bedside.  

## 2017-02-07 NOTE — ED Notes (Signed)
Ortho reported no surgery at this time and patient will be allowed to eat at this time.

## 2017-02-07 NOTE — Consult Note (Signed)
Reason for Consult:RLE pain Referring Physician: Derrek Gu  Tamara Howe is an 72 y.o. female with an extensive and complex medical history. HPI: Tamara Howe was in her usual state of health until late December. Tamara Howe sees pain medicine for chronic right knee pain and had the knee injected around 12/21. Tamara Howe is unsure what it was injected with. Tamara Howe usually has some mild improvement in her pain after a day or two but that didn't happen this time. Some time after the leg and knee started to hurt worse. Tamara Howe developed redness and a wound that drained. Tamara Howe came to the ED for evaluation. Tamara Howe denies fevers, chills, sweats, N/V. No prior hx/o similar.  Past Medical History:  Diagnosis Date  . Abdominal mass, LLQ (left lower quadrant)   . Anemia   . Anxiety   . Asthma   . Bladder infection, chronic   . Chest pain   . Chronic pain   . Complication of anesthesia    "stopped breathing with tumor removal surgery, not happened since then"  . Constipation   . Cough   . Depression   . Dietary counseling   . Dysuria   . Edema extremities   . Enthesopathy of knee   . Esophagitis   . GERD (gastroesophageal reflux disease)   . Headache   . Heart murmur   . High risk medication use   . History of bronchitis   . Hypercholesterolemia   . Hypertension   . Insomnia   . Ischemic cerebrovascular accident (CVA) of frontal lobe ([place])   . Lipoma of skin   . Low back pain   . Malaise and fatigue   . Nocturia   . Noncompliance with medications   . Osteoarthritis   . PONV (postoperative nausea and vomiting)   . Prediabetes   . Rash   . Shortness of breath dyspnea   . Vitamin D deficiency     Past Surgical History:  Procedure Laterality Date  . ABDOMINAL HYSTERECTOMY    . ANKLE SURGERY Right   . APPENDECTOMY    . CARDIAC CATHETERIZATION     12/13/12 [facility], Dr. Gibson Ramp): 30-40% mid LAD.  Marland Kitchen SALIVARY GLAND SURGERY    . TUMOR EXCISION      Family History  Problem Relation Age of Onset  . Hypertension  Father   . Diabetes Mellitus II Father   . Diabetes Mellitus II Sister     Social History:  reports that  has never smoked. Tamara Howe has never used smokeless tobacco. Tamara Howe reports that Tamara Howe does not drink alcohol or use drugs.  Allergies:  Allergies  Allergen Reactions  . Aspirin Shortness Of Breath    respiratory distress  . Codeine Nausea Only  . Peanut (Diagnostic) Anaphylaxis    hives and wheezing, anaphylaxis  . Penicillins Anaphylaxis    Has patient had a PCN reaction causing immediate rash, facial/tongue/throat swelling, SOB or lightheadedness with hypotension: Yes Has patient had a PCN reaction causing severe rash involving mucus membranes or skin necrosis: No Has patient had a PCN reaction that required hospitalization Yes Has patient had a PCN reaction occurring within the last 10 years: No If all of the above answers are "NO", then may proceed with Cephalosporin use.   . Shellfish Allergy Anaphylaxis    Respiratory arrest  . Tetracycline Anaphylaxis    Respiratory arrest  . Cephalexin Hives  . Iodinated Diagnostic Agents Hives  . Latex Hives  . Morphine Other (See Comments)    Possible resp arrest?? Patient  is unsure  . Nitrofuran Derivatives Other (See Comments)    Unknown by patient  . Sulfa Antibiotics Hives    Medications: I have reviewed the patient's current medications.  Results for orders placed or performed during the hospital encounter of 02/06/17 (from the past 48 hour(s))  Basic metabolic panel     Status: Abnormal   Collection Time: 02/07/17  1:28 AM  Result Value Ref Range   Sodium 133 (L) 135 - 145 mmol/L   Potassium 4.3 3.5 - 5.1 mmol/L   Chloride 101 101 - 111 mmol/L   CO2 23 22 - 32 mmol/L   Glucose, Bld 126 (H) 65 - 99 mg/dL   BUN 24 (H) 6 - 20 mg/dL   Creatinine, Ser 1.14 (H) 0.44 - 1.00 mg/dL   Calcium 9.1 8.9 - 10.3 mg/dL   GFR calc non Af Amer 48 (L) >60 mL/min   GFR calc Af Amer 55 (L) >60 mL/min    Comment: (NOTE) The eGFR has been  calculated using the CKD EPI equation. This calculation has not been validated in all clinical situations. eGFR's persistently <60 mL/min signify possible Chronic Kidney Disease.    Anion gap 9 5 - 15  CBC with Differential     Status: Abnormal   Collection Time: 02/07/17  1:28 AM  Result Value Ref Range   WBC 21.5 (H) 4.0 - 10.5 K/uL   RBC 4.53 3.87 - 5.11 MIL/uL   Hemoglobin 10.9 (L) 12.0 - 15.0 g/dL   HCT 35.0 (L) 36.0 - 46.0 %   MCV 77.3 (L) 78.0 - 100.0 fL   MCH 24.1 (L) 26.0 - 34.0 pg   MCHC 31.1 30.0 - 36.0 g/dL   RDW 16.0 (H) 11.5 - 15.5 %   Platelets 374 150 - 400 K/uL   Neutrophils Relative % 84 %   Neutro Abs 18.1 (H) 1.7 - 7.7 K/uL   Lymphocytes Relative 8 %   Lymphs Abs 1.8 0.7 - 4.0 K/uL   Monocytes Relative 7 %   Monocytes Absolute 1.5 (H) 0.1 - 1.0 K/uL   Eosinophils Relative 1 %   Eosinophils Absolute 0.1 0.0 - 0.7 K/uL   Basophils Relative 0 %   Basophils Absolute 0.0 0.0 - 0.1 K/uL  Lactic acid, plasma     Status: None   Collection Time: 02/07/17  3:43 AM  Result Value Ref Range   Lactic Acid, Venous 1.4 0.5 - 1.9 mmol/L  Basic metabolic panel     Status: Abnormal   Collection Time: 02/07/17  5:12 AM  Result Value Ref Range   Sodium 133 (L) 135 - 145 mmol/L   Potassium 3.9 3.5 - 5.1 mmol/L   Chloride 103 101 - 111 mmol/L   CO2 22 22 - 32 mmol/L   Glucose, Bld 138 (H) 65 - 99 mg/dL   BUN 22 (H) 6 - 20 mg/dL   Creatinine, Ser 1.02 (H) 0.44 - 1.00 mg/dL   Calcium 9.0 8.9 - 10.3 mg/dL   GFR calc non Af Amer 54 (L) >60 mL/min   GFR calc Af Amer >60 >60 mL/min    Comment: (NOTE) The eGFR has been calculated using the CKD EPI equation. This calculation has not been validated in all clinical situations. eGFR's persistently <60 mL/min signify possible Chronic Kidney Disease.    Anion gap 8 5 - 15  CBC     Status: Abnormal   Collection Time: 02/07/17  5:12 AM  Result Value Ref Range   WBC 19.9 (  H) 4.0 - 10.5 K/uL   RBC 4.48 3.87 - 5.11 MIL/uL    Hemoglobin 10.8 (L) 12.0 - 15.0 g/dL   HCT 35.3 (L) 36.0 - 46.0 %   MCV 78.8 78.0 - 100.0 fL   MCH 24.1 (L) 26.0 - 34.0 pg   MCHC 30.6 30.0 - 36.0 g/dL   RDW 16.0 (H) 11.5 - 15.5 %   Platelets 344 150 - 400 K/uL  Type and screen [place]     Status: None   Collection Time: 02/07/17  5:32 AM  Result Value Ref Range   ABO/RH(D) B POS    Antibody Screen NEG    Sample Expiration 02/10/2017   Lactic acid, plasma     Status: None   Collection Time: 02/07/17  6:43 AM  Result Value Ref Range   Lactic Acid, Venous 1.6 0.5 - 1.9 mmol/L    Ct Knee Right Wo Contrast  Result Date: 02/07/2017 CLINICAL DATA:  Acute onset of right knee drainage since injection on December 21st. Right knee pain. EXAM: CT OF THE RIGHT KNEE WITHOUT CONTRAST CT OF THE RIGHT TIBIA FIBULA WITHOUT CONTRAST TECHNIQUE: Multidetector CT imaging of the mid to distal right lower extremity, including the right knee and the right tibia/fibula, was performed according to the standard protocol. COMPARISON:  Right knee radiographs performed 02/06/2017 FINDINGS: Bones/Joint/Cartilage There is no evidence of osseous erosion on CT. There is no evidence of fracture or dislocation. There is mild calcification along the articular cartilage of the patella. There is chronic flattening and depression of the lateral tibial plateau, with diffuse sclerosis at the lateral compartment. A subcortical cyst is noted at the tibial spine. Prominent marginal osteophytes are seen at all 3 compartments. Wall osteophytes are noted at the intercondylar notch. Prominent loose bodies are seen about the anterior, posterior and lateral aspects of the knee. Underlying diffuse osteopenia is noted. A relatively large complex knee joint effusion is noted, with diffuse peripheral soft tissue thickening. This raises concern for septic joint effusion. Ligaments Suboptimally assessed by CT. Muscles and Tendons The quadriceps and patellar tendons appear intact.  The musculature of the right lower leg appears intact. Soft tissues Diffuse soft tissue edema is noted about the knee, and tracking inferiorly along the right lower leg to the level of the ankle. Note is made of focal soft tissue disruption medial to the proximal right tibia, with trace underlying soft tissue air extending superiorly. Would correlate clinically to exclude infection with a gas producing organism. No well defined fluid collection is seen to suggest abscess at this time. There is diffuse calcification of the menisci. IMPRESSION: 1. Relatively large complex knee joint effusion, with diffuse peripheral soft tissue thickening. This is concerning for septic joint effusion. Would correlate with the patient's symptoms. 2. Focal soft tissue disruption medial to the proximal right tibia, with trace underlying soft tissue air extending superiorly. Would correlate clinically to exclude infection with a gas producing organism. No evidence of underlying abscess. 3. No evidence of osseous erosion on CT. Chronic flattening and depression of the lateral tibial plateau, with tricompartmental osteoarthritis. Prominent loose bodies noted about the knee. 4. Diffuse soft tissue edema noted about the knee, and tracking inferiorly along the right lower leg to the level of the ankle. 5. Underlying diffuse osteopenia. These results were called by telephone at the time of interpretation on 02/07/2017 at 6:05 am to the ER clinical team, who verbally acknowledged these results. Electronically Signed   By: Garald Balding  M.D.   On: 02/07/2017 06:07   Ct Tibia Fibula Right Wo Contrast  Result Date: 02/07/2017 CLINICAL DATA:  Acute onset of right knee drainage since injection on December 21st. Right knee pain. EXAM: CT OF THE RIGHT KNEE WITHOUT CONTRAST CT OF THE RIGHT TIBIA FIBULA WITHOUT CONTRAST TECHNIQUE: Multidetector CT imaging of the mid to distal right lower extremity, including the right knee and the right tibia/fibula,  was performed according to the standard protocol. COMPARISON:  Right knee radiographs performed 02/06/2017 FINDINGS: Bones/Joint/Cartilage There is no evidence of osseous erosion on CT. There is no evidence of fracture or dislocation. There is mild calcification along the articular cartilage of the patella. There is chronic flattening and depression of the lateral tibial plateau, with diffuse sclerosis at the lateral compartment. A subcortical cyst is noted at the tibial spine. Prominent marginal osteophytes are seen at all 3 compartments. Wall osteophytes are noted at the intercondylar notch. Prominent loose bodies are seen about the anterior, posterior and lateral aspects of the knee. Underlying diffuse osteopenia is noted. A relatively large complex knee joint effusion is noted, with diffuse peripheral soft tissue thickening. This raises concern for septic joint effusion. Ligaments Suboptimally assessed by CT. Muscles and Tendons The quadriceps and patellar tendons appear intact. The musculature of the right lower leg appears intact. Soft tissues Diffuse soft tissue edema is noted about the knee, and tracking inferiorly along the right lower leg to the level of the ankle. Note is made of focal soft tissue disruption medial to the proximal right tibia, with trace underlying soft tissue air extending superiorly. Would correlate clinically to exclude infection with a gas producing organism. No well defined fluid collection is seen to suggest abscess at this time. There is diffuse calcification of the menisci. IMPRESSION: 1. Relatively large complex knee joint effusion, with diffuse peripheral soft tissue thickening. This is concerning for septic joint effusion. Would correlate with the patient's symptoms. 2. Focal soft tissue disruption medial to the proximal right tibia, with trace underlying soft tissue air extending superiorly. Would correlate clinically to exclude infection with a gas producing organism. No  evidence of underlying abscess. 3. No evidence of osseous erosion on CT. Chronic flattening and depression of the lateral tibial plateau, with tricompartmental osteoarthritis. Prominent loose bodies noted about the knee. 4. Diffuse soft tissue edema noted about the knee, and tracking inferiorly along the right lower leg to the level of the ankle. 5. Underlying diffuse osteopenia. These results were called by telephone at the time of interpretation on 02/07/2017 at 6:05 am to the ER clinical team, who verbally acknowledged these results. Electronically Signed   By: Garald Balding M.D.   On: 02/07/2017 06:07   Dg Knee Complete 4 Views Right  Result Date: 02/06/2017 CLINICAL DATA:  Patient had a right knee injection December 21st now with pain and drainage. EXAM: RIGHT KNEE - COMPLETE 4+ VIEW COMPARISON:  September 24, 2014 FINDINGS: No evidence of fracture, dislocation. There is a small suprapatellar effusion. Severe osteoarthritic changes of the right knee are noted. Soft tissues are unremarkable. IMPRESSION: No acute abnormality.  Severe osteoarthritic changes of right knee. Electronically Signed   By: Abelardo Diesel M.D.   On: 02/06/2017 20:07    Review of Systems  Constitutional: Negative for chills, fever and weight loss.  HENT: Negative for ear discharge, ear pain, hearing loss and tinnitus.   Eyes: Negative for blurred vision, double vision, photophobia and pain.  Respiratory: Negative for cough, sputum production and shortness of  breath.   Cardiovascular: Negative for chest pain.  Gastrointestinal: Negative for abdominal pain, nausea and vomiting.  Genitourinary: Negative for dysuria, flank pain, frequency and urgency.  Musculoskeletal: Positive for joint pain (RLE). Negative for back pain, falls, myalgias and neck pain.  Neurological: Negative for dizziness, tingling, sensory change, focal weakness, loss of consciousness and headaches.  Endo/Heme/Allergies: Does not bruise/bleed easily.    Psychiatric/Behavioral: Negative for depression, memory loss and substance abuse. The patient is not nervous/anxious.    Blood pressure (!) 167/92, pulse 69, temperature 99.1 F (37.3 C), temperature source Oral, resp. rate 18, height '5\' 1"'  (1.549 m), weight 55.3 kg (122 lb), SpO2 100 %. Physical Exam  Constitutional: Tamara Howe appears well-developed and well-nourished. No distress.  HENT:  Head: Normocephalic.  Eyes: Conjunctivae are normal. Right eye exhibits no discharge. Left eye exhibits no discharge. No scleral icterus.  Neck: Normal range of motion.  Cardiovascular: Normal rate and regular rhythm.  Respiratory: Effort normal. No respiratory distress.  Musculoskeletal:  RLE Ulceration medial lower leg 7cm distal to patella, erythema along entire anterior lower leg, no ecchymosis or rash  Moderate TTP anterior shin distal to wound, mild TTP over infrapatellar bursa, no TTP knee  No knee or ankle effusion, chronic valgus deformity  Knee stable to varus/ valgus and anterior/posterior stress, ROM 90-180  Sens DPN, SPN, TN intact  Motor EHL, ext, flex, evers 5/5  DP 1+, PT 0, No significant edema  Neurological: Tamara Howe is alert.  Skin: Skin is warm and dry. Tamara Howe is not diaphoretic.  Psychiatric: Tamara Howe has a normal mood and affect. Her behavior is normal.      Assessment/Plan: RLE pain -- This appears to be limited to cellulitis. I am not concerned for a septic joint given her excellent range of motion. Infrapatellar bursitis a possibility but her exam is not impressive in that area. Her CT does not confirm anything except the cellulitis. Would continue IV abx per IM, we will follow along. [place] for diet.    Lisette Abu, PA-C Orthopedic Surgery [phone] 02/07/2017, 9:05 AM

## 2017-02-07 NOTE — H&P (Signed)
History and Physical    Shiva Karis GQQ:[id number] DOB: Jun 04, 1945 DOA: 02/06/2017  PCP: Tish Frederickson, [facility]  Patient coming from: Home.  Chief Complaint: Right lower extremity swelling and pain.  HPI: Tamara Howe is a 72 y.o. female with history of hypertension, chronic pain, possible CHF, anemia presents to the ER because of worsening swelling and pain of the right lower extremity.  Patient states on December 21 patient had a scheduled right knee injection for pain relief since patient states she was not a candidate for knee replacement as per her primary care physician cardiologist.  Since the injection patient started developing pain and swelling extending below the knee.  Patient states she does have chronic swelling of the right knee.  Also started developing subjective fevers and chills.  As per the patient when finally they were able to contact patient's pain management Dr. Luan Pulling who had injected the knee was advised to come to the ER.  Had one episode of nausea vomiting denies any chest pain or shortness of breath.  Denies any abdominal pain.  ED Course: In the ER patient was initially tachycardic and blood work show leukocytosis.  On exam patient has erythema of the right lower extremity extending below the right knee.  There is also a small bleb with possible past with a possible portal of entry and ER physician had done incision and drainage.  CT scan of the right knee and leg are still pending.  Patient has sensation of the leg on exam.  Is able to move it.  Review of Systems: As per HPI, rest all negative.   Past Medical History:  Diagnosis Date  . Abdominal mass, LLQ (left lower quadrant)   . Anemia   . Anxiety   . Asthma   . Bladder infection, chronic   . Chest pain   . Chronic pain   . Complication of anesthesia    "stopped breathing with tumor removal surgery, not happened since then"  . Constipation   . Cough   . Depression   . Dietary counseling   . Dysuria    . Edema extremities   . Enthesopathy of knee   . Esophagitis   . GERD (gastroesophageal reflux disease)   . Headache   . Heart murmur   . High risk medication use   . History of bronchitis   . Hypercholesterolemia   . Hypertension   . Insomnia   . Ischemic cerebrovascular accident (CVA) of frontal lobe ([place])   . Lipoma of skin   . Low back pain   . Malaise and fatigue   . Nocturia   . Noncompliance with medications   . Osteoarthritis   . PONV (postoperative nausea and vomiting)   . Prediabetes   . Rash   . Shortness of breath dyspnea   . Vitamin D deficiency     Past Surgical History:  Procedure Laterality Date  . ABDOMINAL HYSTERECTOMY    . ANKLE SURGERY Right   . APPENDECTOMY    . CARDIAC CATHETERIZATION     12/13/12 [facility], Dr. Gibson Ramp): 30-40% mid LAD.  Marland Kitchen SALIVARY GLAND SURGERY    . TUMOR EXCISION       reports that  has never smoked. she has never used smokeless tobacco. She reports that she does not drink alcohol or use drugs.  Allergies  Allergen Reactions  . Aspirin Shortness Of Breath    respiratory distress  . Codeine Nausea Only  . Peanut (Diagnostic) Anaphylaxis    hives  and wheezing, anaphylaxis  . Penicillins Anaphylaxis    Has patient had a PCN reaction causing immediate rash, facial/tongue/throat swelling, SOB or lightheadedness with hypotension: Yes Has patient had a PCN reaction causing severe rash involving mucus membranes or skin necrosis: No Has patient had a PCN reaction that required hospitalization Yes Has patient had a PCN reaction occurring within the last 10 years: No If all of the above answers are "NO", then may proceed with Cephalosporin use.   . Shellfish Allergy Anaphylaxis    Respiratory arrest  . Tetracycline Anaphylaxis    Respiratory arrest  . Cephalexin Hives  . Iodinated Diagnostic Agents Hives  . Latex Hives  . Morphine Other (See Comments)    Possible resp arrest?? Patient is unsure  . Nitrofuran Derivatives Other  (See Comments)    Unknown by patient  . Sulfa Antibiotics Hives    Family History  Problem Relation Age of Onset  . Hypertension Other     Prior to Admission medications   Medication Sig Start Date End Date Taking? Authorizing Provider  acetaminophen (TYLENOL) 500 MG tablet Take 1,000 mg by mouth every 6 (six) hours as needed for moderate pain.    [provider]  albuterol (PROVENTIL HFA) 108 (90 Base) MCG/ACT inhaler Inhale 2 puffs into the lungs every 6 (six) hours as needed for wheezing or shortness of breath.    [provider]  albuterol (PROVENTIL) (2.5 MG/3ML) 0.083% nebulizer solution Take 2.5 mg by nebulization every 6 (six) hours as needed for wheezing or shortness of breath.    [provider]  calcium carbonate (TUMS - DOSED IN MG ELEMENTAL CALCIUM) 500 MG chewable tablet Chew 1 tablet by mouth daily.    [provider]  chlorthalidone (HYGROTON) 25 MG tablet Take 25 mg by mouth daily.    [provider]  Cholecalciferol (VITAMIN D3) 3000 units TABS Take 1 tablet by mouth daily.    [provider]  clonazePAM (KLONOPIN) 0.5 MG tablet Take 0.5 mg by mouth 2 (two) times daily as needed for anxiety.    [provider]  diphenhydrAMINE (BENADRYL) 25 mg capsule Take 25 mg by mouth every 6 (six) hours as needed for allergies.    [provider]  divalproex (DEPAKOTE ER) 500 MG 24 hr tablet Take 500 mg by mouth daily.    [provider]  EPINEPHrine (EPIPEN 2-PAK) 0.3 mg/0.3 mL IJ SOAJ injection Inject 0.3 mg into the muscle once. For anaphylaxis    [provider]  ferrous sulfate 325 (65 FE) MG tablet Take 325 mg by mouth daily with breakfast.    [provider]  furosemide (LASIX) 40 MG tablet Take 40 mg by mouth daily as needed for fluid or edema.    [provider]  HYDROcodone-acetaminophen (NORCO) 10-325 MG tablet Take 1 tablet by mouth every 6 (six) hours as needed for  moderate pain.     [provider]  isosorbide dinitrate (ISORDIL) 30 MG tablet Take 30 mg by mouth at bedtime.    [provider]  lansoprazole (PREVACID) 30 MG capsule Take 30 mg by mouth daily as needed (for heartburn).     [provider]  Melatonin 3 MG TABS Take 3 mg by mouth at bedtime.    [provider]  metoprolol (LOPRESSOR) 100 MG tablet Take 100 mg by mouth 2 (two) times daily.    [provider]  NIFEdipine (PROCARDIA-XL/ADALAT CC) 60 MG 24 hr tablet Take 60 mg by  mouth daily.    [provider]  potassium chloride SA (K-DUR,KLOR-CON) 20 MEQ tablet Take 20 mEq by mouth daily.    [provider]  traZODone (DESYREL) 50 MG tablet Take 50 mg by mouth at bedtime.    [provider]  Vitamin D, Ergocalciferol, (DRISDOL) 50000 units CAPS capsule Take 50,000 Units by mouth every Monday, Wednesday, and Friday.     [provider]    Physical Exam: Vitals:   02/06/17 2212 02/07/17 0230 02/07/17 0245 02/07/17 0300  BP: (!) 172/150 (!) 159/81 (!) 153/64 (!) 149/75  Pulse: (!) 150 60 (!) 58 (!) 58  Resp: 18  18   Temp:      TempSrc:      SpO2: 97% 97% 95% 95%  Weight:      Height:          Constitutional: Moderately built and nourished. Vitals:   02/06/17 2212 02/07/17 0230 02/07/17 0245 02/07/17 0300  BP: (!) 172/150 (!) 159/81 (!) 153/64 (!) 149/75  Pulse: (!) 150 60 (!) 58 (!) 58  Resp: 18  18   Temp:      TempSrc:      SpO2: 97% 97% 95% 95%  Weight:      Height:       Eyes: Anicteric no pallor. ENMT: No discharge from the ears eyes nose or mouth. Neck: No mass felt.  No neck rigidity.  No JVD appreciated. Respiratory: No rhonchi or crepitations. Cardiovascular: S1-S2 heard no murmurs appreciated. Abdomen: Soft nontender bowel sounds present. Musculoskeletal: Swelling of the right knee and erythema of the right lower extremity below the knee.  There is a small area where the incision and  drainage has been done. Skin: Erythema extending below the right knee up to the foot. Neurologic: Alert awake oriented to time place and person.  Moves all extremities. Psychiatric: Appears normal.  Normal affect.   Labs on Admission: I have personally reviewed following labs and imaging studies  CBC: Recent Labs  Lab 02/07/17 0128  WBC 21.5*  NEUTROABS 18.1*  HGB 10.9*  HCT 35.0*  MCV 77.3*  PLT 427   Basic Metabolic Panel: Recent Labs  Lab 02/07/17 0128  NA 133*  K 4.3  CL 101  CO2 23  GLUCOSE 126*  BUN 24*  CREATININE 1.14*  CALCIUM 9.1   GFR: Estimated Creatinine Clearance: 34.7 mL/min (A) (by C-G formula based on SCr of 1.14 mg/dL (H)). Liver Function Tests: No results for input(s): AST, ALT, ALKPHOS, BILITOT, PROT, ALBUMIN in the last 168 hours. No results for input(s): LIPASE, AMYLASE in the last 168 hours. No results for input(s): AMMONIA in the last 168 hours. Coagulation Profile: No results for input(s): INR, PROTIME in the last 168 hours. Cardiac Enzymes: No results for input(s): CKTOTAL, CKMB, CKMBINDEX, TROPONINI in the last 168 hours. BNP (last 3 results) No results for input(s): PROBNP in the last 8760 hours. HbA1C: No results for input(s): HGBA1C in the last 72 hours. CBG: No results for input(s): GLUCAP in the last 168 hours. Lipid Profile: No results for input(s): CHOL, HDL, LDLCALC, TRIG, CHOLHDL, LDLDIRECT in the last 72 hours. Thyroid Function Tests: No results for input(s): TSH, T4TOTAL, FREET4, T3FREE, THYROIDAB in the last 72 hours. Anemia Panel: No results for input(s): VITAMINB12, FOLATE, FERRITIN, TIBC, IRON, RETICCTPCT in the last 72 hours. Urine analysis: No results found for: COLORURINE, APPEARANCEUR, LABSPEC, PHURINE, GLUCOSEU, HGBUR, BILIRUBINUR, KETONESUR, PROTEINUR, UROBILINOGEN, NITRITE, LEUKOCYTESUR Sepsis Labs: @LABRCNTIP (procalcitonin:4,lacticidven:4) )No results found for this  or any previous visit (from the past 240  hour(s)).   Radiological Exams on Admission: Dg Knee Complete 4 Views Right  Result Date: 02/06/2017 CLINICAL DATA:  Patient had a right knee injection December 21st now with pain and drainage. EXAM: RIGHT KNEE - COMPLETE 4+ VIEW COMPARISON:  September 24, 2014 FINDINGS: No evidence of fracture, dislocation. There is a small suprapatellar effusion. Severe osteoarthritic changes of the right knee are noted. Soft tissues are unremarkable. IMPRESSION: No acute abnormality.  Severe osteoarthritic changes of right knee. Electronically Signed   By: Abelardo Diesel M.D.   On: 02/06/2017 20:07     Assessment/Plan Active Problems:   Sepsis ([place])   Cellulitis of left lower extremity   Normocytic normochromic anemia   Essential hypertension    1. Sepsis from right lower extremity cellulitis -at the time of my dictation CT scan results just came and is read as complex right knee effusion concerning for septic arthritis and also there is a possibility of gas-forming organisms.  Patient is placed on vancomycin and Azactam already since patient presented with septic picture.  Will get orthopedic consult.  Will keep patient n.p.o. except medications in anticipation of orthopedic procedure.  Continue with pain relief medications. 2. Hypertension -in addition to patient's home medications including metoprolol Imdur and nifedipine I will place patient on as needed IV hydralazine for now.  Closely follow blood pressure trends. 3. Normocytic normochromic anemia appears to be new compared to the labs done in January 2018 available in care everywhere.  Patient is however on iron supplements so probably patient has anemia previously.  Follow CBC. 4. 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 5. Patient failure probably acute when compared to the labs done in January 2018 -patient received fluids.  Follow metabolic panel holding hydrochlorothiazide for  now. 6. Possible CHF was on Lasix previously which has been held as per the patient since April of last year.   DVT prophylaxis: Foot compressions. Code Status: Full code. Family Communication: Patient's husband. Disposition Plan: To be determined. Consults called: We will try to reach orthopedic surgeon. Admission status: Inpatient.   Rise Patience MD [facility] Pager [phone].  If 7PM-7AM, please contact night-coverage [email or web address] Password [facility]  02/07/2017, 3:43 AM

## 2017-02-07 NOTE — Progress Notes (Signed)
Patient seen and examined  72 y.o. female with history of hypertension, chronic pain, possible CHF, anemia presents to the ER because of worsening swelling and pain of the right lower extremity.  Patient states on December 21 patient had a scheduled right knee injection for pain relief since patient states she was not a candidate for knee replacement as per her primary care physician cardiologist.Since the injection patient started developing pain and swelling extending below the knee. Patient has extensive cellulitis below her knee, extending all the way up to her ankle.  Patient was found to have tachycardia leukocytosis, CT suggestive of right knee effusion. Orthopedics has been consulted. Patient has been started on vancomycin and Azactam. Will check ESR, CRP, Pro calcitonin.

## 2017-02-07 NOTE — ED Notes (Signed)
Patient transported to CT 

## 2017-02-07 NOTE — ED Notes (Signed)
Pt transported to [facility]

## 2017-02-07 NOTE — Progress Notes (Signed)
Pharmacy Antibiotic Note  Tamara Howe is a 72 y.o. female admitted on 02/06/2017 with cellulitis.  Pharmacy has been consulted for Vancomycin dosing. WBC elevated at 21.5. CrCl ~30-35. Recent knee injection.   Plan: Vancomycin 750 mg IV q24h Trend WBC, temp, renal function  F/U infectious work-up Drug levels as indicated   Height: 5\' 1"  (154.9 cm) Weight: 122 lb (55.3 kg) IBW/kg (Calculated) : 47.8  Temp (24hrs), Avg:98.7 F (37.1 C), Min:98.7 F (37.1 C), Max:98.7 F (37.1 C)  Recent Labs  Lab 02/07/17 0128  WBC 21.5*  CREATININE 1.14*    Estimated Creatinine Clearance: 34.7 mL/min (A) (by C-G formula based on SCr of 1.14 mg/dL (H)).    Allergies  Allergen Reactions  . Aspirin Shortness Of Breath    respiratory distress  . Codeine Nausea Only  . Peanut (Diagnostic) Anaphylaxis    hives and wheezing, anaphylaxis  . Penicillins Anaphylaxis    Has patient had a PCN reaction causing immediate rash, facial/tongue/throat swelling, SOB or lightheadedness with hypotension: Yes Has patient had a PCN reaction causing severe rash involving mucus membranes or skin necrosis: No Has patient had a PCN reaction that required hospitalization Yes Has patient had a PCN reaction occurring within the last 10 years: No If all of the above answers are "NO", then may proceed with Cephalosporin use.   . Shellfish Allergy Anaphylaxis    Respiratory arrest  . Tetracycline Anaphylaxis    Respiratory arrest  . Cephalexin Hives  . Iodinated Diagnostic Agents Hives  . Latex Hives  . Morphine Other (See Comments)    Possible resp arrest?? Patient is unsure  . Nitrofuran Derivatives Other (See Comments)    Unknown by patient  . Sulfa Antibiotics Hives    Narda Bonds 02/07/2017 4:39 AM

## 2017-02-08 ENCOUNTER — Encounter (HOSPITAL_COMMUNITY): Payer: Self-pay | Admitting: General Practice

## 2017-02-08 DIAGNOSIS — L02419 Cutaneous abscess of limb, unspecified: Secondary | ICD-10-CM

## 2017-02-08 DIAGNOSIS — L03119 Cellulitis of unspecified part of limb: Secondary | ICD-10-CM

## 2017-02-08 HISTORY — DX: Cellulitis of unspecified part of limb: L03.119

## 2017-02-08 HISTORY — DX: Cutaneous abscess of limb, unspecified: L02.419

## 2017-02-08 LAB — COMPREHENSIVE METABOLIC PANEL
ALT: 18 U/L (ref 14–54)
AST: 18 U/L (ref 15–41)
Albumin: 2.8 g/dL — ABNORMAL LOW (ref 3.5–5.0)
Alkaline Phosphatase: 115 U/L (ref 38–126)
Anion gap: 9 (ref 5–15)
BUN: 17 mg/dL (ref 6–20)
CALCIUM: 8.7 mg/dL — AB (ref 8.9–10.3)
CO2: 23 mmol/L (ref 22–32)
CREATININE: 0.89 mg/dL (ref 0.44–1.00)
Chloride: 104 mmol/L (ref 101–111)
GFR calc non Af Amer: >60 mL/min (ref >60)
Glucose, Bld: 130 mg/dL — ABNORMAL HIGH (ref 65–99)
Potassium: 3.9 mmol/L (ref 3.5–5.1)
SODIUM: 136 mmol/L (ref 135–145)
Total Bilirubin: 0.6 mg/dL (ref 0.3–1.2)
Total Protein: 6.3 g/dL — ABNORMAL LOW (ref 6.5–8.1)

## 2017-02-08 LAB — CBC
HCT: 33.5 % — ABNORMAL LOW (ref 36.0–46.0)
HEMOGLOBIN: 10.2 g/dL — AB (ref 12.0–15.0)
MCH: 23.9 pg — AB (ref 26.0–34.0)
MCHC: 30.4 g/dL (ref 30.0–36.0)
MCV: 78.6 fL (ref 78.0–100.0)
Platelets: 422 10*3/uL — ABNORMAL HIGH (ref 150–400)
RBC: 4.26 MIL/uL (ref 3.87–5.11)
RDW: 16 % — ABNORMAL HIGH (ref 11.5–15.5)
WBC: 18.2 10*3/uL — ABNORMAL HIGH (ref 4.0–10.5)

## 2017-02-08 LAB — MRSA PCR SCREENING: MRSA by PCR: NEGATIVE

## 2017-02-08 LAB — C-REACTIVE PROTEIN: CRP: 19.3 mg/dL — ABNORMAL HIGH (ref <1.0)

## 2017-02-08 NOTE — Progress Notes (Signed)
PROGRESS NOTE    Tamara Howe  MRN:[id number] DOB: 05/20/1945 DOA: 02/06/2017 PCP: [facility]   Brief Narrative:   71-year-old female with history of hypertension, chronic pain, anemia admitted with swelling, erythema and pain of right lower extremity after lidocaine injection for pain relief.  In ER patient was noted to have tachycardia, leukocytosis, erythema of the right lower knee extending below the knee.  Assessment & Plan:   Active Problems:   Sepsis (HCC)   Cellulitis of right lower extremity   Normocytic normochromic anemia   Essential hypertension  Sepsis: As per admitting physician's note patient met sepsis criteria with tachycardia, leukocytosis at the time of admission.  Blood cultures sent.  She is currently on IV vancomycin.  Per admitting physician's note she was also given aztreonam.  Lab work today shows that she still has leukocytosis.  Right lower extremity cellulitis: She continues to have pain and erythema of her right lower extremity below her knee.  Also just below her knee joint patient noted to have an open area with purulent drainage.  Ordered wound cultures.  She continues to have leukocytosis of 18.2 today.  Patient has severe allergy to penicillins.  Also allergic to cephalosporin.  Continue IV vancomycin.  She may need something stronger than aztreonam.  Consulted infectious disease due to ongoing symptoms.  Hypertension: Continue metoprolol, nifedipine, imdur.  Continue to do blood pressure and adjust medications as needed.   DVT prophylaxis: SCD's Code Status: Full code Family Communication: Discussed with the patient Disposition Plan: 2 -3 days   Consultants:  Consult infectious disease   Procedures:  None  Antimicrobials: IV vancomycin, aztreonam     Subjective: She continues to have pain and erythema of her right lower extremity below her knee.  Right below the knee joint there is an open area with purulent  drainage.  Objective: Vitals:   02/07/17 1948 02/08/17 0106 02/08/17 0441 02/08/17 1047  BP: (!) 153/66 139/79 (!) 148/65 (!) 150/66  Pulse: 77 64 72   Resp: (!) 22 18 (!) 22   Temp: 99.4 F (37.4 C) 98.4 F (36.9 C) 98 F (36.7 C)   TempSrc: Oral Oral Oral   SpO2: 99% 100% 100%   Weight: 60.5 kg (133 lb 6.4 oz)  60.4 kg (133 lb 3.2 oz)   Height: 5' 3" (1.6 m)       Intake/Output Summary (Last 24 hours) at 02/08/2017 1431 Last data filed at 02/08/2017 0600 Gross per 24 hour  Intake 700 ml  Output 250 ml  Net 450 ml   Filed Weights   02/06/17 1848 02/07/17 1948 02/08/17 0441  Weight: 55.3 kg (122 lb) 60.5 kg (133 lb 6.4 oz) 60.4 kg (133 lb 3.2 oz)    Examination:  General exam: c/o pain in her lower extremity, does not appear to be in any acute distress at this time. Respiratory system: Clear to auscultation. Respiratory effort normal. Cardiovascular system: S1 & S2 heard, RRR. No JVD, murmurs, rubs, gallops or clicks. No pedal edema. Gastrointestinal system: Abdomen is nondistended, soft and nontender. No organomegaly or masses felt. Normal bowel sounds heard. Central nervous system: Alert and oriented. No focal neurological deficits. Extremities: Right knee swelling, lower extremity erythema, below the right knee open area with purulent drainage, tenderness present, left lower extremity okay Skin: Lower extremity findings as mentioned above.  No other rashes or ulcers noted at this time. Psychiatry: Judgement and insight appear normal. Mood & affect appropriate.    Data Reviewed: I   have personally reviewed following labs and imaging studies  CBC: Recent Labs  Lab 02/07/17 0128 02/07/17 0512 02/08/17 0422  WBC 21.5* 19.9* 18.2*  NEUTROABS 18.1*  --   --   HGB 10.9* 10.8* 10.2*  HCT 35.0* 35.3* 33.5*  MCV 77.3* 78.8 78.6  PLT 374 344 422*   Basic Metabolic Panel: Recent Labs  Lab 02/07/17 0128 02/07/17 0512 02/08/17 0422  NA 133* 133* 136  K 4.3 3.9 3.9  CL  101 103 104  CO2 23 22 23  GLUCOSE 126* 138* 130*  BUN 24* 22* 17  CREATININE 1.14* 1.02* 0.89  CALCIUM 9.1 9.0 8.7*   GFR: Estimated Creatinine Clearance: 48.7 mL/min (by C-G formula based on SCr of 0.89 mg/dL). Liver Function Tests: Recent Labs  Lab 02/08/17 0422  AST 18  ALT 18  ALKPHOS 115  BILITOT 0.6  PROT 6.3*  ALBUMIN 2.8*   No results for input(s): LIPASE, AMYLASE in the last 168 hours. No results for input(s): AMMONIA in the last 168 hours. Coagulation Profile: No results for input(s): INR, PROTIME in the last 168 hours. Cardiac Enzymes: No results for input(s): CKTOTAL, CKMB, CKMBINDEX, TROPONINI in the last 168 hours. BNP (last 3 results) No results for input(s): PROBNP in the last 8760 hours. HbA1C: No results for input(s): HGBA1C in the last 72 hours. CBG: No results for input(s): GLUCAP in the last 168 hours. Lipid Profile: No results for input(s): CHOL, HDL, LDLCALC, TRIG, CHOLHDL, LDLDIRECT in the last 72 hours. Thyroid Function Tests: No results for input(s): TSH, T4TOTAL, FREET4, T3FREE, THYROIDAB in the last 72 hours. Anemia Panel: No results for input(s): VITAMINB12, FOLATE, FERRITIN, TIBC, IRON, RETICCTPCT in the last 72 hours. Sepsis Labs: Recent Labs  Lab 02/07/17 0343 02/07/17 0643 02/07/17 1000  PROCALCITON  --   --  0.39  LATICACIDVEN 1.4 1.6  --     Recent Results (from the past 240 hour(s))  Wound or Superficial Culture     Status: None (Preliminary result)   Collection Time: 02/07/17  4:14 AM  Result Value Ref Range Status   Specimen Description ABSCESS  Final   Special Requests LEFT LOWER EXTREMITY  Final   Gram Stain   Final    RARE WBC PRESENT, PREDOMINANTLY PMN FEW GRAM POSITIVE COCCI IN PAIRS IN CLUSTERS    Culture   Final    MODERATE STAPHYLOCOCCUS AUREUS SUSCEPTIBILITIES TO FOLLOW    Report Status PENDING  Incomplete  Culture, blood (routine x 2)     Status: None (Preliminary result)   Collection Time: 02/07/17  9:10  AM  Result Value Ref Range Status   Specimen Description BLOOD RIGHT HAND  Final   Special Requests IN PEDIATRIC BOTTLE Blood Culture adequate volume  Final   Culture NO GROWTH < 24 HOURS  Final   Report Status PENDING  Incomplete  Culture, blood (routine x 2)     Status: None (Preliminary result)   Collection Time: 02/07/17  9:20 AM  Result Value Ref Range Status   Specimen Description BLOOD LEFT HAND  Final   Special Requests IN PEDIATRIC BOTTLE Blood Culture adequate volume  Final   Culture NO GROWTH < 24 HOURS  Final   Report Status PENDING  Incomplete  MRSA PCR Screening     Status: None   Collection Time: 02/08/17  6:06 AM  Result Value Ref Range Status   MRSA by PCR NEGATIVE NEGATIVE Final    Comment:        The GeneXpert   MRSA Assay (FDA approved for NASAL specimens only), is one component of a comprehensive MRSA colonization surveillance program. It is not intended to diagnose MRSA infection nor to guide or monitor treatment for MRSA infections.      Radiology Studies: Ct Knee Right Wo Contrast  Result Date: 02/07/2017 CLINICAL DATA:  Acute onset of right knee drainage since injection on December 21st. Right knee pain. EXAM: CT OF THE RIGHT KNEE WITHOUT CONTRAST CT OF THE RIGHT TIBIA FIBULA WITHOUT CONTRAST TECHNIQUE: Multidetector CT imaging of the mid to distal right lower extremity, including the right knee and the right tibia/fibula, was performed according to the standard protocol. COMPARISON:  Right knee radiographs performed 02/06/2017 FINDINGS: Bones/Joint/Cartilage There is no evidence of osseous erosion on CT. There is no evidence of fracture or dislocation. There is mild calcification along the articular cartilage of the patella. There is chronic flattening and depression of the lateral tibial plateau, with diffuse sclerosis at the lateral compartment. A subcortical cyst is noted at the tibial spine. Prominent marginal osteophytes are seen at all 3 compartments.  Wall osteophytes are noted at the intercondylar notch. Prominent loose bodies are seen about the anterior, posterior and lateral aspects of the knee. Underlying diffuse osteopenia is noted. A relatively large complex knee joint effusion is noted, with diffuse peripheral soft tissue thickening. This raises concern for septic joint effusion. Ligaments Suboptimally assessed by CT. Muscles and Tendons The quadriceps and patellar tendons appear intact. The musculature of the right lower leg appears intact. Soft tissues Diffuse soft tissue edema is noted about the knee, and tracking inferiorly along the right lower leg to the level of the ankle. Note is made of focal soft tissue disruption medial to the proximal right tibia, with trace underlying soft tissue air extending superiorly. Would correlate clinically to exclude infection with a gas producing organism. No well defined fluid collection is seen to suggest abscess at this time. There is diffuse calcification of the menisci. IMPRESSION: 1. Relatively large complex knee joint effusion, with diffuse peripheral soft tissue thickening. This is concerning for septic joint effusion. Would correlate with the patient's symptoms. 2. Focal soft tissue disruption medial to the proximal right tibia, with trace underlying soft tissue air extending superiorly. Would correlate clinically to exclude infection with a gas producing organism. No evidence of underlying abscess. 3. No evidence of osseous erosion on CT. Chronic flattening and depression of the lateral tibial plateau, with tricompartmental osteoarthritis. Prominent loose bodies noted about the knee. 4. Diffuse soft tissue edema noted about the knee, and tracking inferiorly along the right lower leg to the level of the ankle. 5. Underlying diffuse osteopenia. These results were called by telephone at the time of interpretation on 02/07/2017 at 6:05 am to the ER clinical team, who verbally acknowledged these results.  Electronically Signed   By: Jeffery  Chang M.D.   On: 02/07/2017 06:07   Ct Tibia Fibula Right Wo Contrast  Result Date: 02/07/2017 CLINICAL DATA:  Acute onset of right knee drainage since injection on December 21st. Right knee pain. EXAM: CT OF THE RIGHT KNEE WITHOUT CONTRAST CT OF THE RIGHT TIBIA FIBULA WITHOUT CONTRAST TECHNIQUE: Multidetector CT imaging of the mid to distal right lower extremity, including the right knee and the right tibia/fibula, was performed according to the standard protocol. COMPARISON:  Right knee radiographs performed 02/06/2017 FINDINGS: Bones/Joint/Cartilage There is no evidence of osseous erosion on CT. There is no evidence of fracture or dislocation. There is mild calcification along the articular cartilage   of the patella. There is chronic flattening and depression of the lateral tibial plateau, with diffuse sclerosis at the lateral compartment. A subcortical cyst is noted at the tibial spine. Prominent marginal osteophytes are seen at all 3 compartments. Wall osteophytes are noted at the intercondylar notch. Prominent loose bodies are seen about the anterior, posterior and lateral aspects of the knee. Underlying diffuse osteopenia is noted. A relatively large complex knee joint effusion is noted, with diffuse peripheral soft tissue thickening. This raises concern for septic joint effusion. Ligaments Suboptimally assessed by CT. Muscles and Tendons The quadriceps and patellar tendons appear intact. The musculature of the right lower leg appears intact. Soft tissues Diffuse soft tissue edema is noted about the knee, and tracking inferiorly along the right lower leg to the level of the ankle. Note is made of focal soft tissue disruption medial to the proximal right tibia, with trace underlying soft tissue air extending superiorly. Would correlate clinically to exclude infection with a gas producing organism. No well defined fluid collection is seen to suggest abscess at this time.  There is diffuse calcification of the menisci. IMPRESSION: 1. Relatively large complex knee joint effusion, with diffuse peripheral soft tissue thickening. This is concerning for septic joint effusion. Would correlate with the patient's symptoms. 2. Focal soft tissue disruption medial to the proximal right tibia, with trace underlying soft tissue air extending superiorly. Would correlate clinically to exclude infection with a gas producing organism. No evidence of underlying abscess. 3. No evidence of osseous erosion on CT. Chronic flattening and depression of the lateral tibial plateau, with tricompartmental osteoarthritis. Prominent loose bodies noted about the knee. 4. Diffuse soft tissue edema noted about the knee, and tracking inferiorly along the right lower leg to the level of the ankle. 5. Underlying diffuse osteopenia. These results were called by telephone at the time of interpretation on 02/07/2017 at 6:05 am to the ER clinical team, who verbally acknowledged these results. Electronically Signed   By: Garald Balding M.D.   On: 02/07/2017 06:07   Dg Knee Complete 4 Views Right  Result Date: 02/06/2017 CLINICAL DATA:  Patient had a right knee injection December 21st now with pain and drainage. EXAM: RIGHT KNEE - COMPLETE 4+ VIEW COMPARISON:  September 24, 2014 FINDINGS: No evidence of fracture, dislocation. There is a small suprapatellar effusion. Severe osteoarthritic changes of the right knee are noted. Soft tissues are unremarkable. IMPRESSION: No acute abnormality.  Severe osteoarthritic changes of right knee. Electronically Signed   By: Abelardo Diesel M.D.   On: 02/06/2017 20:07    Scheduled Meds: . calcium carbonate  1 tablet Oral Q breakfast  . divalproex  500 mg Oral Daily  . ferrous sulfate  325 mg Oral Q breakfast  . isosorbide dinitrate  30 mg Oral QHS  . Melatonin  3 mg Oral QHS  . metoprolol tartrate  100 mg Oral BID  . NIFEdipine  60 mg Oral Daily  . pantoprazole  40 mg Oral Daily  .  traZODone  50 mg Oral QHS  . Vitamin D (Ergocalciferol)  50,000 Units Oral Q M,W,F   Continuous Infusions: . vancomycin 750 mg (02/08/17 0539)     LOS: 1 day    Time spent: >30 min   Yaakov Guthrie,  MD [facility] Pager [phone]  If 7PM-7AM, please contact night-coverage [email or web address] Password [facility] 02/08/2017, 2:31 PM

## 2017-02-08 NOTE — Progress Notes (Signed)
Patient is being non-compliant about calling before getting out of bed.  Placed bed alarm on and explained to pt to call next time she needs to get up.  Went to check on patient later on and she states she went to bathroom using with husband using walker.  Bed alarm was not on at this time.  Placed bed alarm on, and explained to patient and family again, to call before getting up.

## 2017-02-08 NOTE — Progress Notes (Signed)
Patient ID: Tamara Howe, female   DOB: 04-20-1945, 72 y.o.   MRN: [id number]   LOS: 1 day   Subjective: Knee feels about the same   Objective: Vital signs in last 24 hours: Temp:  [98 F (36.7 C)-99.4 F (37.4 C)] 98 F (36.7 C) (01/03 0441) Pulse Rate:  [52-77] 72 (01/03 0441) Resp:  [15-26] 22 (01/03 0441) BP: (127-185)/(57-94) 148/65 (01/03 0441) SpO2:  [93 %-100 %] 100 % (01/03 0441) Weight:  [60.4 kg (133 lb 3.2 oz)-60.5 kg (133 lb 6.4 oz)] 60.4 kg (133 lb 3.2 oz) (01/03 0441) Last BM Date: 02/06/17   Laboratory  CBC Recent Labs    02/07/17 0512 02/08/17 0422  WBC 19.9* 18.2*  HGB 10.8* 10.2*  HCT 35.3* 33.5*  PLT 344 422*   BMET Recent Labs    02/07/17 0512 02/08/17 0422  NA 133* 136  K 3.9 3.9  CL 103 104  CO2 22 23  GLUCOSE 138* 130*  BUN 22* 17  CREATININE 1.02* 0.89  CALCIUM 9.0 8.7*     Physical Exam General appearance: alert and no distress  RLE: Purulence expressible from anterior knee wound, still mod TTP. PROM 180-100 (somewhat improved over yesterday).   Assessment/Plan: RLE cellulitis -- Given improvement in WBC (though modest), no fevers, and increase ROM, would be inclined to continue IV abx and observation. Will reassess this afternoon.    Lisette Abu, PA-C Orthopedic Surgery [phone] 02/08/2017

## 2017-02-09 DIAGNOSIS — L03115 Cellulitis of right lower limb: Secondary | ICD-10-CM

## 2017-02-09 DIAGNOSIS — L02415 Cutaneous abscess of right lower limb: Secondary | ICD-10-CM

## 2017-02-09 LAB — BASIC METABOLIC PANEL
Anion gap: 10 (ref 5–15)
BUN: 27 mg/dL — AB (ref 6–20)
CHLORIDE: 109 mmol/L (ref 101–111)
CO2: 18 mmol/L — AB (ref 22–32)
Calcium: 8.9 mg/dL (ref 8.9–10.3)
Creatinine, Ser: 1.08 mg/dL — ABNORMAL HIGH (ref 0.44–1.00)
GFR calc Af Amer: 59 mL/min — ABNORMAL LOW (ref >60)
GFR calc non Af Amer: 51 mL/min — ABNORMAL LOW (ref >60)
GLUCOSE: 95 mg/dL (ref 65–99)
Potassium: 5 mmol/L (ref 3.5–5.1)
Sodium: 137 mmol/L (ref 135–145)

## 2017-02-09 LAB — CBC WITH DIFFERENTIAL/PLATELET
BASOS PCT: 1 %
Basophils Absolute: 0.1 10*3/uL (ref 0.0–0.1)
EOS PCT: 2 %
Eosinophils Absolute: 0.3 10*3/uL (ref 0.0–0.7)
HCT: 29.9 % — ABNORMAL LOW (ref 36.0–46.0)
Hemoglobin: 9.6 g/dL — ABNORMAL LOW (ref 12.0–15.0)
LYMPHS ABS: 3 10*3/uL (ref 0.7–4.0)
Lymphocytes Relative: 20 %
MCH: 24.3 pg — AB (ref 26.0–34.0)
MCHC: 32.1 g/dL (ref 30.0–36.0)
MCV: 75.7 fL — AB (ref 78.0–100.0)
Monocytes Absolute: 1.5 10*3/uL — ABNORMAL HIGH (ref 0.1–1.0)
Monocytes Relative: 10 %
NEUTROS ABS: 10 10*3/uL — AB (ref 1.7–7.7)
NEUTROS PCT: 67 %
PLATELETS: 359 10*3/uL (ref 150–400)
RBC: 3.95 MIL/uL (ref 3.87–5.11)
RDW: 15.9 % — ABNORMAL HIGH (ref 11.5–15.5)
WBC: 14.9 10*3/uL — ABNORMAL HIGH (ref 4.0–10.5)

## 2017-02-09 LAB — AEROBIC CULTURE W GRAM STAIN (SUPERFICIAL SPECIMEN)

## 2017-02-09 LAB — CBC
HEMATOCRIT: 29.4 % — AB (ref 36.0–46.0)
HEMOGLOBIN: 8.9 g/dL — AB (ref 12.0–15.0)
MCH: 23.2 pg — AB (ref 26.0–34.0)
MCHC: 30.3 g/dL (ref 30.0–36.0)
MCV: 76.6 fL — AB (ref 78.0–100.0)
Platelets: 367 10*3/uL (ref 150–400)
RBC: 3.84 MIL/uL — ABNORMAL LOW (ref 3.87–5.11)
RDW: 15.6 % — ABNORMAL HIGH (ref 11.5–15.5)
WBC: 14.4 10*3/uL — ABNORMAL HIGH (ref 4.0–10.5)

## 2017-02-09 LAB — AEROBIC CULTURE  (SUPERFICIAL SPECIMEN)

## 2017-02-09 MED ORDER — CLINDAMYCIN HCL 150 MG PO CAPS
450.0000 mg | ORAL_CAPSULE | Freq: Three times a day (TID) | ORAL | Status: DC
  Administered 2017-02-09 – 2017-02-11 (×7): 450 mg via ORAL
  Filled 2017-02-09 (×8): qty 1

## 2017-02-09 MED ORDER — METOPROLOL TARTRATE 50 MG PO TABS
50.0000 mg | ORAL_TABLET | Freq: Two times a day (BID) | ORAL | Status: DC
  Administered 2017-02-09 – 2017-02-11 (×4): 50 mg via ORAL
  Filled 2017-02-09 (×4): qty 1

## 2017-02-09 NOTE — Consult Note (Signed)
[facility] for Infectious Disease    Date of Admission:  02/06/2017     Total days of antibiotics 3  Vancomycin Day 3           Reason for Consult: Cellulitis / Septic Joint  Referring Provider: [facility] Primary Care Provider: [facility]   Assessment/Plan:  Tamara Howe appears to have cellulitis of her right knee following an injection likely of corticosteroid. Orthopedics evaluated and does not believe that the knee joint is infected. The blood cultures drawn for sepsis are with no growth to date and she has remained afebrile since admission pointing towards a localized infection.  Right lower extremity cellulitis - Discontinue vancomycin and change to Clindamycin 450 mg QID for a total of 5 days. Follow up with orthopedics as recommended.  No further ID treatment necessary and will sign off. Please call back as needed.   Principal Problem:   Cellulitis and abscess of right lower extremity Active Problems:   Sepsis ([place])   Cellulitis of left lower extremity   Normocytic normochromic anemia   Essential hypertension   . calcium carbonate  1 tablet Oral Q breakfast  . divalproex  500 mg Oral Daily  . ferrous sulfate  325 mg Oral Q breakfast  . isosorbide dinitrate  30 mg Oral QHS  . Melatonin  3 mg Oral QHS  . metoprolol tartrate  50 mg Oral BID  . NIFEdipine  60 mg Oral Daily  . pantoprazole  40 mg Oral Daily  . traZODone  50 mg Oral QHS  . Vitamin D (Ergocalciferol)  50,000 Units Oral Q M,W,F     HPI: Tamara Howe is a 72 y.o. female with a history of osteoarthritis  presenting to the ED and admitted to the hospital on 02/07/16 with the chief complaint of drainage located from her right knee following a corticosteroid injection on 12/21 by Dr. Maryjean Ka office. Noted to have redness, swelling and warmth with a fluctuant area just below the joint. Subjective fevers and chills. There was no joint effusion on physical exam and had full range of motion. Lab work  with leukocytosis of 21,000 with a left shift. The abscess was incised and drained with cultures being sent.  CT scan of the right knee and distal lower leg showed relatively mild complex effusion with soft tissue thickening concerning for septic joint effusion; focal soft tissue disruption medial to the proximal right tibia, extending superior concern for possible gas producing organism no evidence of abscess; no osseous erosion chronic flattening and depression of the lateral tibial plateau tricompartmental osteoarthritis loose bodies noted about the knees.  Soft tissue edema noted around the knee and tracking inferiorly along the right lower leg to the level of the ankle. She was started on Vancomycin and Aztreonam.  Seen by orthopedics on 02/08/16 noting cellulitis and possible infrapatellar bursitis as symptoms were not consistent with septic joint and recommended to continue antibiotics.   She is currently on Day 3 of vancomycin. Has remained afebrile since admission. Leukocytosis appears to be improving down to 14.4 on 1/3. Blood cultures drawn on 02/08/16 are no growth to date. The abscess culture was positive for staphylococcus aureus.    Review of Systems: Review of Systems  Constitutional: Negative for chills and fever.  Respiratory: Negative for cough, shortness of breath and wheezing.   Cardiovascular: Negative for chest pain.  Gastrointestinal: Negative for constipation, diarrhea, nausea and vomiting.  Skin: Positive for rash.  Neurological: Negative for weakness.  Past Medical History:  Diagnosis Date  . Abdominal mass, LLQ (left lower quadrant)   . Anemia   . Anxiety   . Asthma   . Bladder infection, chronic   . Cellulitis and abscess of leg 02/08/2017  . Chest pain   . Chronic pain   . Complication of anesthesia    "stopped breathing with tumor removal surgery, not happened since then"  . Constipation   . Cough   . Depression   . Dietary counseling   . Dysuria   .  Edema extremities   . Enthesopathy of knee   . Esophagitis   . GERD (gastroesophageal reflux disease)   . Headache   . Heart murmur   . High risk medication use   . History of bronchitis   . Hypercholesterolemia   . Hypertension   . Insomnia   . Ischemic cerebrovascular accident (CVA) of frontal lobe ([place])   . Lipoma of skin   . Low back pain   . Malaise and fatigue   . Nocturia   . Noncompliance with medications   . Osteoarthritis   . PONV (postoperative nausea and vomiting)   . Prediabetes   . Rash   . Shortness of breath dyspnea   . Vitamin D deficiency     Social History   Tobacco Use  . Smoking status: Never Smoker  . Smokeless tobacco: Never Used  Substance Use Topics  . Alcohol use: No  . Drug use: No    Family History  Problem Relation Age of Onset  . Hypertension Father   . Diabetes Mellitus II Father   . Diabetes Mellitus II Sister     Allergies  Allergen Reactions  . Aspirin Shortness Of Breath    respiratory distress  . Codeine Nausea Only  . Peanut (Diagnostic) Anaphylaxis    hives and wheezing, anaphylaxis  . Penicillins Anaphylaxis    Has patient had a PCN reaction causing immediate rash, facial/tongue/throat swelling, SOB or lightheadedness with hypotension: Yes Has patient had a PCN reaction causing severe rash involving mucus membranes or skin necrosis: No Has patient had a PCN reaction that required hospitalization Yes Has patient had a PCN reaction occurring within the last 10 years: No If all of the above answers are "NO", then may proceed with Cephalosporin use.   . Shellfish Allergy Anaphylaxis    Respiratory arrest  . Tetracycline Anaphylaxis    Respiratory arrest  . Cephalexin Hives  . Iodinated Diagnostic Agents Hives  . Latex Hives  . Morphine Other (See Comments)    Possible resp arrest?? Patient is unsure  . Nitrofuran Derivatives Other (See Comments)    Unknown by patient  . Sulfa Antibiotics Hives     OBJECTIVE: Blood pressure (!) 121/55, pulse (!) 55, temperature 97.7 F (36.5 C), temperature source Oral, resp. rate 18, height 5\' 3"  (1.6 m), weight 132 lb 9.6 oz (60.1 kg), SpO2 96 %.  Physical Exam  Constitutional: She is well-developed, well-nourished, and in no distress. No distress.  Cardiovascular: Normal rate, regular rhythm, normal heart sounds and intact distal pulses. Exam reveals no gallop.  No murmur heard. Pulmonary/Chest: Effort normal and breath sounds normal. No respiratory distress. She has no wheezes. She has no rales. She exhibits no tenderness.  Musculoskeletal:  Right distal lower extremity with no obvious deformity or edema. Significant thermal burn colored red noted along the tibia on the medial and lateral aspect down to the level of the malleolus. Previous cyst appears without evidence of  infection. Range of motion, pulses and sensation are intact and appropriate.     Lab Results Lab Results  Component Value Date   WBC 14.4 (H) 02/09/2017   HGB 8.9 (L) 02/09/2017   HCT 29.4 (L) 02/09/2017   MCV 76.6 (L) 02/09/2017   PLT 367 02/09/2017    Lab Results  Component Value Date   CREATININE 0.89 02/08/2017   BUN 17 02/08/2017   NA 136 02/08/2017   K 3.9 02/08/2017   CL 104 02/08/2017   CO2 23 02/08/2017    Lab Results  Component Value Date   ALT 18 02/08/2017   AST 18 02/08/2017   ALKPHOS 115 02/08/2017   BILITOT 0.6 02/08/2017     Microbiology: Recent Results (from the past 240 hour(s))  Wound or Superficial Culture     Status: None   Collection Time: 02/07/17  4:14 AM  Result Value Ref Range Status   Specimen Description ABSCESS  Final   Special Requests LEFT LOWER EXTREMITY  Final   Gram Stain   Final    RARE WBC PRESENT, PREDOMINANTLY PMN FEW GRAM POSITIVE COCCI IN PAIRS IN CLUSTERS    Culture MODERATE STAPHYLOCOCCUS AUREUS  Final   Report Status 02/09/2017 FINAL  Final   Organism ID, Bacteria STAPHYLOCOCCUS AUREUS  Final       Susceptibility   Staphylococcus aureus - MIC*    CIPROFLOXACIN <=0.5 SENSITIVE Sensitive     ERYTHROMYCIN 0.5 SENSITIVE Sensitive     GENTAMICIN <=0.5 SENSITIVE Sensitive     OXACILLIN 0.5 SENSITIVE Sensitive     TETRACYCLINE <=1 SENSITIVE Sensitive     VANCOMYCIN 1 SENSITIVE Sensitive     TRIMETH/SULFA <=10 SENSITIVE Sensitive     CLINDAMYCIN <=0.25 SENSITIVE Sensitive     RIFAMPIN <=0.5 SENSITIVE Sensitive     Inducible Clindamycin NEGATIVE Sensitive     * MODERATE STAPHYLOCOCCUS AUREUS  Culture, blood (routine x 2)     Status: None (Preliminary result)   Collection Time: 02/07/17  9:10 AM  Result Value Ref Range Status   Specimen Description BLOOD RIGHT HAND  Final   Special Requests IN PEDIATRIC BOTTLE Blood Culture adequate volume  Final   Culture NO GROWTH < 24 HOURS  Final   Report Status PENDING  Incomplete  Culture, blood (routine x 2)     Status: None (Preliminary result)   Collection Time: 02/07/17  9:20 AM  Result Value Ref Range Status   Specimen Description BLOOD LEFT HAND  Final   Special Requests IN PEDIATRIC BOTTLE Blood Culture adequate volume  Final   Culture NO GROWTH < 24 HOURS  Final   Report Status PENDING  Incomplete  MRSA PCR Screening     Status: None   Collection Time: 02/08/17  6:06 AM  Result Value Ref Range Status   MRSA by PCR NEGATIVE NEGATIVE Final    Comment:        The GeneXpert MRSA Assay (FDA approved for NASAL specimens only), is one component of a comprehensive MRSA colonization surveillance program. It is not intended to diagnose MRSA infection nor to guide or monitor treatment for MRSA infections.   Aerobic Culture (superficial specimen)     Status: None (Preliminary result)   Collection Time: 02/08/17  1:56 PM  Result Value Ref Range Status   Specimen Description ABSCESS  Final   Special Requests Normal  Final   Gram Stain   Final    NO WBC SEEN RARE SQUAMOUS EPITHELIAL CELLS PRESENT MODERATE GRAM POSITIVE COCCI  IN  CLUSTERS    Culture TOO YOUNG TO READ  Final   Report Status PENDING  Incomplete     Terri Piedra, NP [facility] [phone] Pager  02/09/2017  2:32 PM

## 2017-02-09 NOTE — Care Management Note (Signed)
Case Management Note  Patient Details  Name: Tamara Howe MRN: [id number] Date of Birth: September 20, 1945  Subjective/Objective: Sepsis/ Cellulitis                  Action/Plan: Patient lives at home with spouse in [place]; PCP is Dr Luan Pulling; has private insurance with [employer] with prescription drug coverage; pharmacy of choice is [facility]; DME- walker and cane at home; [facility] choice offered, pt chose [facility] - which changed their name to [facility]. [phone]) fax # [phone]; Attending MD please enter the face to face for [facility] services at discharge.  Expected Discharge Date: possibly 02/13/2017                 Expected Discharge Plan:  [place]  Discharge planning Services  CM Consult  Choice offered to:  Patient  HH Arranged:  RN, PT [facility] Agency:  [facility]  Status of Service:  In process, will continue to follow  Sherrilyn Rist [phone] 02/09/2017, 11:10 AM

## 2017-02-09 NOTE — Progress Notes (Signed)
Patient ID: Tamara Howe, female   DOB: [birth year], 72 y.o.   MRN: [id number]   LOS: 2 days   Subjective: Feeling better this morning   Objective: Vital signs in last 24 hours: Temp:  [97.9 F (36.6 C)-98.3 F (36.8 C)] 97.9 F (36.6 C) (01/04 0738) Pulse Rate:  [60-92] 74 (01/04 0738) Resp:  [18-20] 18 (01/04 0738) BP: (108-161)/(52-73) 108/58 (01/04 0738) SpO2:  [94 %-96 %] 96 % (01/04 0738) Weight:  [60.1 kg (132 lb 9.6 oz)] 60.1 kg (132 lb 9.6 oz) (01/04 0538) Last BM Date: 02/08/17   Laboratory  CBC Recent Labs    02/08/17 0422 02/09/17 0514  WBC 18.2* 14.4*  HGB 10.2* 8.9*  HCT 33.5* 29.4*  PLT 422* 367   BMET Recent Labs    02/07/17 0512 02/08/17 0422  NA 133* 136  K 3.9 3.9  CL 103 104  CO2 22 23  GLUCOSE 138* 130*  BUN 22* 17  CREATININE 1.02* 0.89  CALCIUM 9.0 8.7*     Physical Exam General appearance: alert and no distress  RLE: Erythema slightly improved, no expressible purulence from ulceration but still quite TTP, ROM improved to 180 to <90.   Assessment/Plan: RLE cellulitis -- Ok from orthopedic standpoint for discharge on oral MRSA coverage but will leave that to primary team. May f/u with Dr. Percell Miller as OP prn if ulceration doesn't heal.    Lisette Abu, PA-C Orthopedic Surgery [phone] 02/09/2017

## 2017-02-09 NOTE — Progress Notes (Signed)
PROGRESS NOTE    Tamara Howe  PPJ:[id number] DOB: Oct 24, 1945 DOA: 02/06/2017 [facility]CP: [facility]   Brief Narrative:   72 year old female with history of hypertension, chronic pain, anemia admitted with swelling, erythema and pain of right lower extremity after lidocaine injection for pain relief.  In ER patient was noted to have tachycardia, leukocytosis, erythema of the right lower knee extending below the knee.  Assessment & Plan:   Active Problems:   Sepsis ([place])   Cellulitis of right lower extremity   Normocytic normochromic anemia   Essential hypertension  Sepsis: As per admitting physician's note patient met sepsis criteria with tachycardia, leukocytosis at the time of admission.  Blood cultures sent.  She is currently on IV vancomycin.  Per admitting physician's note she was also given aztreonam.  Lab work today shows that she still has leukocytosis.  Right lower extremity cellulitis: She continues to have pain and erythema of her right lower extremity below her knee.  She still has some purulent drainage but improving.  Wound cultures sent. Gram stain showing few gram-positive cocci in pairs and clusters; staph aureus. Patient has severe allergy to penicillins.  Also allergic to Cephalexin.  Continue IV vancomycin.  Consulted infectious disease due to ongoing symptoms.  Hypertension: Blood pressure low this morning.  She is however afebrile.  On metoprolol, nifedipine, imdur, will place hold parameters.  Continue to monitor blood pressure and adjust medications as needed.   DVT prophylaxis: SCD's Code Status: Full code Family Communication: Discussed with the patient Disposition Plan: 2 -3 days   Consultants:  Consult infectious disease   Procedures:  None  Antimicrobials: IV vancomycin     Subjective: She continues to have pain and erythema of her right lower extremity below her knee.  Still draining some purulent discharge but  improving.  Objective: Vitals:   02/09/17 0026 02/09/17 0453 02/09/17 0538 02/09/17 0738  BP: (!) 156/66 (!) 114/52  (!) 108/58  Pulse: 78 60  74  Resp: '18 18  18  ' Temp: 98.3 F (36.8 C) 98 F (36.7 C)  97.9 F (36.6 C)  TempSrc: Oral Other (Comment)  Oral  SpO2: 94% 95%  96%  Weight:   60.1 kg (132 lb 9.6 oz)   Height:        Intake/Output Summary (Last 24 hours) at 02/09/2017 0856 Last data filed at 02/09/2017 0851 Gross per 24 hour  Intake 480 ml  Output 0 ml  Net 480 ml   Filed Weights   02/07/17 1948 02/08/17 0441 02/09/17 0538  Weight: 60.5 kg (133 lb 6.4 oz) 60.4 kg (133 lb 3.2 oz) 60.1 kg (132 lb 9.6 oz)    Examination:  General exam: c/o pain in her lower extremity, does not appear to be in any acute distress at this time. Respiratory system: Clear to auscultation. Respiratory effort normal. Cardiovascular system: S1 & S2 heard, RRR. No JVD, murmurs, rubs, gallops or clicks. No pedal edema. Gastrointestinal system: Abdomen is nondistended, soft and nontender. No organomegaly or masses felt. Normal bowel sounds heard. Central nervous system: Alert and oriented. No focal neurological deficits. Extremities: Right knee swelling, lower extremity erythema, below the right knee open area with purulent drainage, tenderness present, left lower extremity okay Skin: Lower extremity findings as mentioned above.  No other rashes or ulcers noted at this time. Psychiatry: Judgement and insight appear normal. Mood & affect appropriate.    Data Reviewed: I have personally reviewed following labs and imaging studies  CBC: Recent Labs  Lab 02/07/17 0128 02/07/17 0512 02/08/17 0422 02/09/17 0514  WBC 21.5* 19.9* 18.2* 14.4*  NEUTROABS 18.1*  --   --   --   HGB 10.9* 10.8* 10.2* 8.9*  HCT 35.0* 35.3* 33.5* 29.4*  MCV 77.3* 78.8 78.6 76.6*  PLT 374 344 422* 062   Basic Metabolic Panel: Recent Labs  Lab 02/07/17 0128 02/07/17 0512 02/08/17 0422  NA 133* 133* 136  K 4.3  3.9 3.9  CL 101 103 104  CO2 '23 22 23  ' GLUCOSE 126* 138* 130*  BUN 24* 22* 17  CREATININE 1.14* 1.02* 0.89  CALCIUM 9.1 9.0 8.7*   GFR: Estimated Creatinine Clearance: 48.7 mL/min (by C-G formula based on SCr of 0.89 mg/dL). Liver Function Tests: Recent Labs  Lab 02/08/17 0422  AST 18  ALT 18  ALKPHOS 115  BILITOT 0.6  PROT 6.3*  ALBUMIN 2.8*   No results for input(s): LIPASE, AMYLASE in the last 168 hours. No results for input(s): AMMONIA in the last 168 hours. Coagulation Profile: No results for input(s): INR, PROTIME in the last 168 hours. Cardiac Enzymes: No results for input(s): CKTOTAL, CKMB, CKMBINDEX, TROPONINI in the last 168 hours. BNP (last 3 results) No results for input(s): PROBNP in the last 8760 hours. HbA1C: No results for input(s): HGBA1C in the last 72 hours. CBG: No results for input(s): GLUCAP in the last 168 hours. Lipid Profile: No results for input(s): CHOL, HDL, LDLCALC, TRIG, CHOLHDL, LDLDIRECT in the last 72 hours. Thyroid Function Tests: No results for input(s): TSH, T4TOTAL, FREET4, T3FREE, THYROIDAB in the last 72 hours. Anemia Panel: No results for input(s): VITAMINB12, FOLATE, FERRITIN, TIBC, IRON, RETICCTPCT in the last 72 hours. Sepsis Labs: Recent Labs  Lab 02/07/17 0343 02/07/17 0643 02/07/17 1000  PROCALCITON  --   --  0.39  LATICACIDVEN 1.4 1.6  --     Recent Results (from the past 240 hour(s))  Wound or Superficial Culture     Status: None (Preliminary result)   Collection Time: 02/07/17  4:14 AM  Result Value Ref Range Status   Specimen Description ABSCESS  Final   Special Requests LEFT LOWER EXTREMITY  Final   Gram Stain   Final    RARE WBC PRESENT, PREDOMINANTLY PMN FEW GRAM POSITIVE COCCI IN PAIRS IN CLUSTERS    Culture   Final    MODERATE STAPHYLOCOCCUS AUREUS SUSCEPTIBILITIES TO FOLLOW    Report Status PENDING  Incomplete  Culture, blood (routine x 2)     Status: None (Preliminary result)   Collection Time:  02/07/17  9:10 AM  Result Value Ref Range Status   Specimen Description BLOOD RIGHT HAND  Final   Special Requests IN PEDIATRIC BOTTLE Blood Culture adequate volume  Final   Culture NO GROWTH < 24 HOURS  Final   Report Status PENDING  Incomplete  Culture, blood (routine x 2)     Status: None (Preliminary result)   Collection Time: 02/07/17  9:20 AM  Result Value Ref Range Status   Specimen Description BLOOD LEFT HAND  Final   Special Requests IN PEDIATRIC BOTTLE Blood Culture adequate volume  Final   Culture NO GROWTH < 24 HOURS  Final   Report Status PENDING  Incomplete  MRSA PCR Screening     Status: None   Collection Time: 02/08/17  6:06 AM  Result Value Ref Range Status   MRSA by PCR NEGATIVE NEGATIVE Final    Comment:        The GeneXpert MRSA Assay (FDA  approved for NASAL specimens only), is one component of a comprehensive MRSA colonization surveillance program. It is not intended to diagnose MRSA infection nor to guide or monitor treatment for MRSA infections.   Aerobic Culture (superficial specimen)     Status: None (Preliminary result)   Collection Time: 02/08/17  1:56 PM  Result Value Ref Range Status   Specimen Description ABSCESS  Final   Special Requests Normal  Final   Gram Stain   Final    NO WBC SEEN RARE SQUAMOUS EPITHELIAL CELLS PRESENT MODERATE GRAM POSITIVE COCCI IN CLUSTERS    Culture PENDING  Incomplete   Report Status PENDING  Incomplete     Radiology Studies: No results found.  Scheduled Meds: . calcium carbonate  1 tablet Oral Q breakfast  . divalproex  500 mg Oral Daily  . ferrous sulfate  325 mg Oral Q breakfast  . isosorbide dinitrate  30 mg Oral QHS  . Melatonin  3 mg Oral QHS  . metoprolol tartrate  100 mg Oral BID  . NIFEdipine  60 mg Oral Daily  . pantoprazole  40 mg Oral Daily  . traZODone  50 mg Oral QHS  . Vitamin D (Ergocalciferol)  50,000 Units Oral Q M,W,F   Continuous Infusions: . vancomycin 750 mg (02/09/17 0506)      LOS: 2 days    Time spent: >30 min   Yaakov Guthrie,  MD [facility] Pager [phone]  If 7PM-7AM, please contact night-coverage [email or web address] Password [email or web address] 02/09/2017, 8:56 AM

## 2017-02-10 LAB — CBC
HEMATOCRIT: 30.4 % — AB (ref 36.0–46.0)
HEMOGLOBIN: 9.3 g/dL — AB (ref 12.0–15.0)
MCH: 23.5 pg — ABNORMAL LOW (ref 26.0–34.0)
MCHC: 30.6 g/dL (ref 30.0–36.0)
MCV: 77 fL — AB (ref 78.0–100.0)
Platelets: 397 10*3/uL (ref 150–400)
RBC: 3.95 MIL/uL (ref 3.87–5.11)
RDW: 15.8 % — AB (ref 11.5–15.5)
WBC: 12 10*3/uL — AB (ref 4.0–10.5)

## 2017-02-10 LAB — BASIC METABOLIC PANEL
Anion gap: 10 (ref 5–15)
BUN: 31 mg/dL — ABNORMAL HIGH (ref 6–20)
CALCIUM: 8.6 mg/dL — AB (ref 8.9–10.3)
CHLORIDE: 107 mmol/L (ref 101–111)
CO2: 20 mmol/L — AB (ref 22–32)
Creatinine, Ser: 1.16 mg/dL — ABNORMAL HIGH (ref 0.44–1.00)
GFR calc Af Amer: 54 mL/min — ABNORMAL LOW (ref >60)
GFR calc non Af Amer: 47 mL/min — ABNORMAL LOW (ref >60)
GLUCOSE: 117 mg/dL — AB (ref 65–99)
POTASSIUM: 4 mmol/L (ref 3.5–5.1)
Sodium: 137 mmol/L (ref 135–145)

## 2017-02-10 MED ORDER — ENOXAPARIN SODIUM 30 MG/0.3ML ~~LOC~~ SOLN
30.0000 mg | SUBCUTANEOUS | Status: DC
  Administered 2017-02-10 – 2017-02-11 (×2): 30 mg via SUBCUTANEOUS
  Filled 2017-02-10 (×2): qty 0.3

## 2017-02-10 NOTE — Progress Notes (Signed)
PROGRESS NOTE    Tamara Howe  QIH:[id number] DOB: 1945/06/10 DOA: 02/06/2017 PCP: [facility]   Brief Narrative:   72 year old female with history of hypertension, chronic pain, anemia admitted with swelling, erythema and pain of right lower extremity after lidocaine injection for pain relief.  In ER patient was noted to have tachycardia, leukocytosis, erythema of the right lower knee extending below the knee.  Assessment & Plan:   Active Problems:   Sepsis ([place])   Cellulitis of right lower extremity   Normocytic normochromic anemia   Essential hypertension  Sepsis: As per admitting physician's note patient met sepsis criteria with tachycardia, leukocytosis at the time of admission.  Blood cultures sent.  She is currently on IV vancomycin.  Per admitting physician's note she was also given aztreonam.  The patient is stable at this time.  WBC count improving.  Right lower extremity cellulitis: She continues to have pain along with erythema and drainage of her right lower extremity below her knee.  Wound cultures showing MSSA.  Patient has severe allergy to penicillins.  Also allergic to Cephalexin.   Was treated with IV vancomycin.  Consulted infectious disease and they recommended to switch to oral clindamycin.  However, patient still having significant pain and is concerned.   Both the patient as well as her husband are not comfortable with her being discharged today.   Consulted PT/OT for evaluation.  Continue clindamycin as per infectious disease recommendations.  Hypertension:  Blood pressure stable.   On metoprolol, nifedipine, imdur, with hold parameters.   Continue to monitor blood pressure and adjust medications as needed.   DVT prophylaxis: SCD's ordered but patient unable to tolerate.  Will place on subcutaneous Lovenox for DVT prophylaxis. Code Status: Full code Family Communication: Discussed with the patient and husband at bedside Disposition Plan: 1-2 days  pending improvement   Consultants:  Infectious disease   Procedures:  None  Antimicrobials:  Clindamycin  Subjective: She continues c/o pain, erythema and drainage of her right lower extremity below her knee.   Objective: Vitals:   02/09/17 2053 02/10/17 0636 02/10/17 0837 02/10/17 1153  BP: 136/80 140/66 (!) 156/73 (!) 141/57  Pulse: 81 75 82 67  Resp: '18 18  20  ' Temp: 98.2 F (36.8 C) 98 F (36.7 C)  98.2 F (36.8 C)  TempSrc: Oral Oral  Oral  SpO2: 96% 95%  95%  Weight:  60.9 kg (134 lb 3.2 oz)    Height:        Intake/Output Summary (Last 24 hours) at 02/10/2017 1209 Last data filed at 02/10/2017 0923 Gross per 24 hour  Intake 360 ml  Output 150 ml  Net 210 ml   Filed Weights   02/08/17 0441 02/09/17 0538 02/10/17 0636  Weight: 60.4 kg (133 lb 3.2 oz) 60.1 kg (132 lb 9.6 oz) 60.9 kg (134 lb 3.2 oz)    Examination:  General exam: c/o pain in her lower extremity, does not appear to be in any acute distress at this time. Respiratory system: Clear to auscultation. Respiratory effort normal. Cardiovascular system: S1 & S2 heard, RRR. No JVD, murmurs, rubs, gallops or clicks. No pedal edema. Gastrointestinal system: Abdomen is nondistended, soft and nontender. No organomegaly or masses felt. Normal bowel sounds heard. Central nervous system: Alert and oriented. No focal neurological deficits. Extremities: Right knee swelling, right lower extremity erythema, below the right knee open area with purulent drainage, tenderness present, left lower extremity okay Skin: Lower extremity findings as mentioned above.  No other rashes or ulcers noted at this time. Psychiatry: Judgement and insight appear normal. Mood & affect appropriate.    Data Reviewed: I have personally reviewed following labs and imaging studies  CBC: Recent Labs  Lab 02/07/17 0128 02/07/17 0512 02/08/17 0422 02/09/17 0514 02/09/17 1356 02/10/17 0922  WBC 21.5* 19.9* 18.2* 14.4* 14.9* 12.0*    NEUTROABS 18.1*  --   --   --  10.0*  --   HGB 10.9* 10.8* 10.2* 8.9* 9.6* 9.3*  HCT 35.0* 35.3* 33.5* 29.4* 29.9* 30.4*  MCV 77.3* 78.8 78.6 76.6* 75.7* 77.0*  PLT 374 344 422* 367 359 884   Basic Metabolic Panel: Recent Labs  Lab 02/07/17 0128 02/07/17 0512 02/08/17 0422 02/09/17 1356  NA 133* 133* 136 137  K 4.3 3.9 3.9 5.0  CL 101 103 104 109  CO2 '23 22 23 ' 18*  GLUCOSE 126* 138* 130* 95  BUN 24* 22* 17 27*  CREATININE 1.14* 1.02* 0.89 1.08*  CALCIUM 9.1 9.0 8.7* 8.9   GFR: Estimated Creatinine Clearance: 40.1 mL/min (A) (by C-G formula based on SCr of 1.08 mg/dL (H)). Liver Function Tests: Recent Labs  Lab 02/08/17 0422  AST 18  ALT 18  ALKPHOS 115  BILITOT 0.6  PROT 6.3*  ALBUMIN 2.8*   No results for input(s): LIPASE, AMYLASE in the last 168 hours. No results for input(s): AMMONIA in the last 168 hours. Coagulation Profile: No results for input(s): INR, PROTIME in the last 168 hours. Cardiac Enzymes: No results for input(s): CKTOTAL, CKMB, CKMBINDEX, TROPONINI in the last 168 hours. BNP (last 3 results) No results for input(s): PROBNP in the last 8760 hours. HbA1C: No results for input(s): HGBA1C in the last 72 hours. CBG: No results for input(s): GLUCAP in the last 168 hours. Lipid Profile: No results for input(s): CHOL, HDL, LDLCALC, TRIG, CHOLHDL, LDLDIRECT in the last 72 hours. Thyroid Function Tests: No results for input(s): TSH, T4TOTAL, FREET4, T3FREE, THYROIDAB in the last 72 hours. Anemia Panel: No results for input(s): VITAMINB12, FOLATE, FERRITIN, TIBC, IRON, RETICCTPCT in the last 72 hours. Sepsis Labs: Recent Labs  Lab 02/07/17 0343 02/07/17 0643 02/07/17 1000  PROCALCITON  --   --  0.39  LATICACIDVEN 1.4 1.6  --     Recent Results (from the past 240 hour(s))  Wound or Superficial Culture     Status: None   Collection Time: 02/07/17  4:14 AM  Result Value Ref Range Status   Specimen Description ABSCESS  Final   Special Requests  LEFT LOWER EXTREMITY  Final   Gram Stain   Final    RARE WBC PRESENT, PREDOMINANTLY PMN FEW GRAM POSITIVE COCCI IN PAIRS IN CLUSTERS    Culture MODERATE STAPHYLOCOCCUS AUREUS  Final   Report Status 02/09/2017 FINAL  Final   Organism ID, Bacteria STAPHYLOCOCCUS AUREUS  Final      Susceptibility   Staphylococcus aureus - MIC*    CIPROFLOXACIN <=0.5 SENSITIVE Sensitive     ERYTHROMYCIN 0.5 SENSITIVE Sensitive     GENTAMICIN <=0.5 SENSITIVE Sensitive     OXACILLIN 0.5 SENSITIVE Sensitive     TETRACYCLINE <=1 SENSITIVE Sensitive     VANCOMYCIN 1 SENSITIVE Sensitive     TRIMETH/SULFA <=10 SENSITIVE Sensitive     CLINDAMYCIN <=0.25 SENSITIVE Sensitive     RIFAMPIN <=0.5 SENSITIVE Sensitive     Inducible Clindamycin NEGATIVE Sensitive     * MODERATE STAPHYLOCOCCUS AUREUS  Culture, blood (routine x 2)     Status: None (Preliminary result)  Collection Time: 02/07/17  9:10 AM  Result Value Ref Range Status   Specimen Description BLOOD RIGHT HAND  Final   Special Requests IN PEDIATRIC BOTTLE Blood Culture adequate volume  Final   Culture NO GROWTH 3 DAYS  Final   Report Status PENDING  Incomplete  Culture, blood (routine x 2)     Status: None (Preliminary result)   Collection Time: 02/07/17  9:20 AM  Result Value Ref Range Status   Specimen Description BLOOD LEFT HAND  Final   Special Requests IN PEDIATRIC BOTTLE Blood Culture adequate volume  Final   Culture NO GROWTH 3 DAYS  Final   Report Status PENDING  Incomplete  MRSA PCR Screening     Status: None   Collection Time: 02/08/17  6:06 AM  Result Value Ref Range Status   MRSA by PCR NEGATIVE NEGATIVE Final    Comment:        The GeneXpert MRSA Assay (FDA approved for NASAL specimens only), is one component of a comprehensive MRSA colonization surveillance program. It is not intended to diagnose MRSA infection nor to guide or monitor treatment for MRSA infections.   Aerobic Culture (superficial specimen)     Status: None  (Preliminary result)   Collection Time: 02/08/17  1:56 PM  Result Value Ref Range Status   Specimen Description ABSCESS  Final   Special Requests Normal  Final   Gram Stain   Final    NO WBC SEEN RARE SQUAMOUS EPITHELIAL CELLS PRESENT MODERATE GRAM POSITIVE COCCI IN CLUSTERS    Culture ABUNDANT STAPHYLOCOCCUS AUREUS  Final   Report Status PENDING  Incomplete     Radiology Studies: No results found.  Scheduled Meds: . calcium carbonate  1 tablet Oral Q breakfast  . clindamycin  450 mg Oral Q8H  . divalproex  500 mg Oral Daily  . ferrous sulfate  325 mg Oral Q breakfast  . isosorbide dinitrate  30 mg Oral QHS  . Melatonin  3 mg Oral QHS  . metoprolol tartrate  50 mg Oral BID  . NIFEdipine  60 mg Oral Daily  . pantoprazole  40 mg Oral Daily  . traZODone  50 mg Oral QHS  . Vitamin D (Ergocalciferol)  50,000 Units Oral Q M,W,F   Continuous Infusions:    LOS: 3 days    Time spent: >30 min   Yaakov Guthrie,  MD [facility] Pager [phone]  If 7PM-7AM, please contact night-coverage [email or web address] Password [email or web address] 02/10/2017, 12:09 PM

## 2017-02-11 LAB — BASIC METABOLIC PANEL
Anion gap: 10 (ref 5–15)
BUN: 22 mg/dL — ABNORMAL HIGH (ref 6–20)
CHLORIDE: 104 mmol/L (ref 101–111)
CO2: 21 mmol/L — AB (ref 22–32)
CREATININE: 1.05 mg/dL — AB (ref 0.44–1.00)
Calcium: 8.6 mg/dL — ABNORMAL LOW (ref 8.9–10.3)
GFR calc Af Amer: >60 mL/min (ref >60)
GFR calc non Af Amer: 53 mL/min — ABNORMAL LOW (ref >60)
GLUCOSE: 107 mg/dL — AB (ref 65–99)
POTASSIUM: 3.9 mmol/L (ref 3.5–5.1)
SODIUM: 135 mmol/L (ref 135–145)

## 2017-02-11 LAB — CBC
HEMATOCRIT: 28.7 % — AB (ref 36.0–46.0)
HEMOGLOBIN: 8.6 g/dL — AB (ref 12.0–15.0)
MCH: 23 pg — AB (ref 26.0–34.0)
MCHC: 30 g/dL (ref 30.0–36.0)
MCV: 76.7 fL — AB (ref 78.0–100.0)
Platelets: 374 10*3/uL (ref 150–400)
RBC: 3.74 MIL/uL — AB (ref 3.87–5.11)
RDW: 15.7 % — ABNORMAL HIGH (ref 11.5–15.5)
WBC: 11.5 10*3/uL — ABNORMAL HIGH (ref 4.0–10.5)

## 2017-02-11 LAB — AEROBIC CULTURE  (SUPERFICIAL SPECIMEN)
GRAM STAIN: NONE SEEN
SPECIAL REQUESTS: NORMAL

## 2017-02-11 LAB — AEROBIC CULTURE W GRAM STAIN (SUPERFICIAL SPECIMEN)

## 2017-02-11 MED ORDER — CLINDAMYCIN HCL 150 MG PO CAPS: 450.0000 mg | ORAL_CAPSULE | Freq: Three times a day (TID) | ORAL | 0 refills | Status: AC

## 2017-02-11 MED ORDER — METOPROLOL TARTRATE 50 MG PO TABS: 50.0000 mg | ORAL_TABLET | Freq: Two times a day (BID) | ORAL | 0 refills | Status: DC

## 2017-02-11 MED ORDER — POTASSIUM CHLORIDE CRYS ER 20 MEQ PO TBCR: 20.0000 meq | EXTENDED_RELEASE_TABLET | Freq: Every day | ORAL | 0 refills | Status: DC

## 2017-02-11 NOTE — Progress Notes (Addendum)
RN found two pills on pt's bed under pad. The pills were a ferrous tablet and a protonix tablet. RN discarded medication in sharps.

## 2017-02-11 NOTE — Evaluation (Signed)
Occupational Therapy Evaluation Patient Details Name: Tamara Howe MRN: [id number] DOB: 1945/03/08 Today's Date: 02/11/2017    History of Present Illness 72 year old female with history of hypertension, chronic pain, anemia admitted with swelling, erythema and pain of right lower extremity after lidocaine injection for pain relief.  In ER patient was noted to have tachycardia, leukocytosis, erythema of the right lower knee extending below the knee.   Clinical Impression   Pt admitted with the above diagnoses and presents with below problem list. Pt will benefit from continued acute OT to address the below listed deficits and maximize independence with basic ADLs prior to d/c home with spouse assisting. PTA pt was mod I with in-home functional mobility/transfers, assist for bathing. Pt currently mod A with LB ADLs and functional tranfers, cues at times for cognitive component of task. Spouse present throughout session.       Follow Up Recommendations  Home health OT;Supervision/Assistance - 24 hour    Equipment Recommendations  None recommended by OT    Recommendations for Other Services PT consult     Precautions / Restrictions Precautions Precautions: Fall Precaution Comments: h/o multiple falls  Restrictions Weight Bearing Restrictions: No      Mobility Bed Mobility Overal bed mobility: Needs Assistance Bed Mobility: Supine to Sit     Supine to sit: Min guard;HOB elevated        Transfers Overall transfer level: Needs assistance Equipment used: Rolling walker (2 wheeled) Transfers: Sit to/from [facility] Sit to Stand: Min assist;Mod assist Stand pivot transfers: Min assist;Mod assist       General transfer comment: mod A on first stand, min A on second attempt with bed height elevated. Mostly min A to pivot but occasional mod for balance with pain and fatigue impacting performance. Very slow speed and guarded. Sequencing deficits?    Balance  Overall balance assessment: History of Falls;Needs assistance Sitting-balance support: No upper extremity supported;Feet supported Sitting balance-Leahy Scale: Fair     Standing balance support: Bilateral upper extremity supported;During functional activity Standing balance-Leahy Scale: Poor                             ADL either performed or assessed with clinical judgement   ADL Overall ADL's : Needs assistance/impaired Eating/Feeding: Set up;Sitting   Grooming: Set up;Sitting   Upper Body Bathing: Set up;Sitting   Lower Body Bathing: Moderate assistance;Sit to/from stand   Upper Body Dressing : Set up;Sitting   Lower Body Dressing: Moderate assistance;Sit to/from stand   Toilet Transfer: Moderate assistance;Stand-pivot;BSC;RW   Toileting- Clothing Manipulation and Hygiene: Moderate assistance;Sit to/from stand       Functional mobility during ADLs: (pivotol steps EOB to recliner) General ADL Comments: Pt completed bed mobility and 2x sit<>stand from EOB. Stood about 2 minutes initially then fatigued and pain. Rest break for a few minutes then pivotol steps to recliner at foot of bed.      Vision         Perception     Praxis      Pertinent Vitals/Pain Pain Assessment: Faces Faces Pain Scale: Hurts even more Pain Location: RLE Pain Descriptors / Indicators: Aching;Sore Pain Intervention(s): Limited activity within patient's tolerance;Monitored during session;Repositioned     Hand Dominance     Extremity/Trunk Assessment Upper Extremity Assessment Upper Extremity Assessment: Generalized weakness   Lower Extremity Assessment Lower Extremity Assessment: Defer to PT evaluation   Cervical / Trunk Assessment Cervical / Trunk Assessment:  Kyphotic   Communication Communication Communication: No difficulties   Cognition Arousal/Alertness: Awake/alert Behavior During Therapy: Flat affect;Anxious Overall Cognitive Status: Impaired/Different from  baseline Area of Impairment: Memory;Problem solving;Attention                   Current Attention Level: Selective Memory: Decreased short-term memory;Decreased recall of precautions       Problem Solving: Slow processing;Difficulty sequencing;Requires verbal cues;Requires tactile cues General Comments: tearful at times during session.   General Comments  Spouse present and involved in pt's care.     Exercises     Shoulder Instructions      Home Living Family/patient expects to be discharged to:: Private residence Living Arrangements: Spouse/significant other Available Help at Discharge: Family;Available 24 hours/day Type of Home: House Home Access: Level entry     Home Layout: One level               Home Equipment: Walker - 2 wheels;Walker - 4 wheels;Cane - single point;Bedside commode;Wheelchair - Press photographer          Prior Functioning/Environment Level of Independence: Needs assistance  Gait / Transfers Assistance Needed: cane at baseline for ambulating in the home; w/c for longer distances.  ADL's / Homemaking Assistance Needed: spouse assist with bathing/dressing            OT Problem List: Decreased strength;Decreased activity tolerance;Impaired balance (sitting and/or standing);Decreased cognition;Decreased safety awareness;Decreased knowledge of use of DME or AE;Decreased knowledge of precautions;Pain      OT Treatment/Interventions: Self-care/ADL training;Energy conservation;DME and/or AE instruction;Therapeutic activities;Patient/family education;Balance training    OT Goals(Current goals can be found in the care plan section) Acute Rehab OT Goals Patient Stated Goal: home, feel better OT Goal Formulation: With patient/family Time For Goal Achievement: 02/25/17 Potential to Achieve Goals: Good ADL Goals Pt Will Perform Lower Body Bathing: sit to/from stand;with supervision Pt Will Perform Lower Body Dressing: with  supervision;sit to/from stand Pt Will Transfer to Toilet: with supervision;ambulating Pt Will Perform Toileting - Clothing Manipulation and hygiene: with supervision;sit to/from stand Pt Will Perform Tub/Shower Transfer: with min assist;ambulating;shower seat;rolling walker  OT Frequency: Min 2X/week   Barriers to D/C:            Co-evaluation              AM-PAC PT "6 Clicks" Daily Activity     Outcome Measure Help from another person eating meals?: None Help from another person taking care of personal grooming?: None Help from another person toileting, which includes using toliet, bedpan, or urinal?: A Lot Help from another person bathing (including washing, rinsing, drying)?: A Lot Help from another person to put on and taking off regular upper body clothing?: A Little Help from another person to put on and taking off regular lower body clothing?: A Lot 6 Click Score: 17   End of Session Equipment Utilized During Treatment: Gait belt;Rolling walker Nurse Communication: Mobility status;Other (comment)(2 pills found in bed)  Activity Tolerance: Patient limited by fatigue;Patient limited by pain Patient left: in chair;with call bell/phone within reach;with chair alarm set;with family/visitor present  OT Visit Diagnosis: Unsteadiness on feet (R26.81);Pain;Other symptoms and signs involving cognitive function Pain - Right/Left: Right Pain - part of body: Leg                Time: 3419-6222 OT Time Calculation (min): 39 min Charges:  OT General Charges $OT Visit: 1 Visit OT Evaluation $OT Eval Low Complexity: 1 Low OT Treatments $Self Care/Home  Management : 8-22 mins G-Codes:       Hortencia Pilar 02/11/2017, 11:59 AM

## 2017-02-11 NOTE — Progress Notes (Addendum)
Pt discharge instructions reviewed with pt and husband. Pt and husband verbalize understanding. Pt belongings with pt. Pt is not in distress. Pt discharged via wheelchair. Pt's husband is driving her home.

## 2017-02-11 NOTE — Evaluation (Signed)
Physical Therapy Evaluation Patient Details Name: Tamara Howe MRN: [id number] DOB: February 23, 1945 Today's Date: 02/11/2017   History of Present Illness  72 year old female with history of hypertension, chronic pain, anemia admitted with swelling, erythema and pain of right lower extremity after lidocaine injection for pain relief.  In ER patient was noted to have tachycardia, leukocytosis, erythema of the right lower knee extending below the knee.  Clinical Impression  Pt admitted with above diagnosis and presents to PT with functional limitations due to deficits listed below (See PT problem list). Pt needs skilled PT to maximize independence and safety to allow discharge to home with husband. Pt will need husband to assist her more at home at dc. Pt can use w/c at home as needed.      Follow Up Recommendations Home health PT;Supervision/Assistance - 24 hour    Equipment Recommendations  None recommended by PT    Recommendations for Other Services       Precautions / Restrictions Precautions Precautions: Fall Precaution Comments: h/o multiple falls  Restrictions Weight Bearing Restrictions: No      Mobility  Bed Mobility Overal bed mobility: Needs Assistance Bed Mobility: Supine to Sit     Supine to sit: Min guard;HOB elevated     General bed mobility comments: Pt up in chair  Transfers Overall transfer level: Needs assistance Equipment used: Rolling walker (2 wheeled) Transfers: Sit to/from [facility] Sit to Stand: Min assist Stand pivot transfers: Min assist       General transfer comment: assist for balance  Ambulation/Gait Ambulation/Gait assistance: Min assist Ambulation Distance (Feet): 10 Feet Assistive device: Rolling walker (2 wheeled) Gait Pattern/deviations: Step-to pattern;Decreased step length - right;Decreased step length - left;Decreased stance time - right Gait velocity: decr Gait velocity interpretation: Below normal speed for  age/gender General Gait Details: Pt with heavy reliance on UE's and pt placing very little weight on RLE due to pain  Stairs            Wheelchair Mobility    Modified Rankin (Stroke Patients Only)       Balance Overall balance assessment: History of Falls;Needs assistance Sitting-balance support: No upper extremity supported;Feet supported Sitting balance-Leahy Scale: Fair     Standing balance support: Bilateral upper extremity supported;During functional activity Standing balance-Leahy Scale: Poor                               Pertinent Vitals/Pain Pain Assessment: Faces Faces Pain Scale: Hurts even more Pain Location: RLE Pain Descriptors / Indicators: Aching;Sore Pain Intervention(s): Limited activity within patient's tolerance;Monitored during session;Repositioned    Home Living Family/patient expects to be discharged to:: Private residence Living Arrangements: Spouse/significant other Available Help at Discharge: Family;Available 24 hours/day Type of Home: House Home Access: Level entry     Home Layout: One level Home Equipment: Walker - 2 wheels;Walker - 4 wheels;Cane - single point;Bedside commode;Wheelchair - Press photographer      Prior Function Level of Independence: Needs assistance   Gait / Transfers Assistance Needed: cane at baseline for ambulating in the home; w/c for longer distances.   ADL's / Homemaking Assistance Needed: spouse assist with bathing/dressing        Hand Dominance        Extremity/Trunk Assessment   Upper Extremity Assessment Upper Extremity Assessment: Defer to OT evaluation    Lower Extremity Assessment Lower Extremity Assessment: RLE deficits/detail;Generalized weakness RLE Deficits / Details: Limited by pain and  arthritis    Cervical / Trunk Assessment Cervical / Trunk Assessment: Kyphotic  Communication   Communication: No difficulties  Cognition Arousal/Alertness: Awake/alert Behavior  During Therapy: Flat affect;Anxious Overall Cognitive Status: History of cognitive impairments - at baseline Area of Impairment: Memory;Problem solving;Attention                   Current Attention Level: Selective Memory: Decreased short-term memory;Decreased recall of precautions       Problem Solving: Slow processing;Difficulty sequencing;Requires verbal cues;Requires tactile cues General Comments: tearful at times during session.      General Comments General comments (skin integrity, edema, etc.): Spouse present and involved in pt's care.     Exercises     Assessment/Plan    PT Assessment Patient needs continued PT services  PT Problem List Decreased strength;Decreased activity tolerance;Decreased balance;Decreased mobility;Pain       PT Treatment Interventions DME instruction;Gait training;Functional mobility training;Therapeutic activities;Therapeutic exercise;Balance training;Patient/family education    PT Goals (Current goals can be found in the Care Plan section)  Acute Rehab PT Goals Patient Stated Goal: return home PT Goal Formulation: With patient Time For Goal Achievement: 02/25/17 Potential to Achieve Goals: Good    Frequency Min 3X/week   Barriers to discharge        Co-evaluation               AM-PAC PT "6 Clicks" Daily Activity  Outcome Measure Difficulty turning over in bed (including adjusting bedclothes, sheets and blankets)?: Unable Difficulty moving from lying on back to sitting on the side of the bed? : Unable Difficulty sitting down on and standing up from a chair with arms (e.g., wheelchair, bedside commode, etc,.)?: Unable Help needed moving to and from a bed to chair (including a wheelchair)?: A Little Help needed walking in hospital room?: A Lot Help needed climbing 3-5 steps with a railing? : Total 6 Click Score: 9    End of Session Equipment Utilized During Treatment: Gait belt Activity Tolerance: Patient limited by  pain Patient left: in chair;with call bell/phone within reach;with chair alarm set;with family/visitor present Nurse Communication: Mobility status PT Visit Diagnosis: Unsteadiness on feet (R26.81);Other abnormalities of gait and mobility (R26.89);Pain Pain - Right/Left: Right Pain - part of body: Leg    Time: 7416-3845 PT Time Calculation (min) (ACUTE ONLY): 34 min   Charges:   PT Evaluation $PT Eval Moderate Complexity: 1 Mod PT Treatments $Gait Training: 8-22 mins   PT G Codes:        [facility] [place] 02/11/2017, 1:51 PM

## 2017-02-11 NOTE — Progress Notes (Signed)
CM states she will see this pt soon regarding Home Health. RN will discharge pt after [place] set up.

## 2017-02-11 NOTE — Progress Notes (Signed)
Patient slept through the night. Patient's temperature was elevated at 100.9. Patient was given tylenol and the temperature went down to 100.0. Patient's temperature this morning is within normal limits at 98.7. Will continue to monitor.   Drue Flirt, RN

## 2017-02-11 NOTE — Care Management (Signed)
Per previous note, [facility] chosen by pt.  No answer at the contact number for agency today.  Demographics, D/C summary, OT, PT notes, and orders faxed to [phone]. Pt given information to follow up.

## 2017-02-11 NOTE — Discharge Summary (Signed)
Physician Discharge Summary  Tamara Howe GQQ:[id number] DOB: November 30, 1945 DOA: 02/06/2017  PCP: Tish Frederickson, [facility]  Admit date: 02/06/2017 Discharge date: 02/11/2017  Admitted From: Home Disposition:  Home  Recommendations for Outpatient Follow-up:  1. Follow up with PCP in 1-2 weeks 2. Please obtain BMP/CBC in one week 3. Please follow up on the following pending results:  Discharge Condition: stable CODE STATUS: full Diet recommendation: Heart Healthy  Brief/Interim Summary: Patient is a 72 year old female with history of hypertension, chronic pain, anemia admitted with swelling, erythema and pain of right lower extremity after lidocaine injection for pain relief.  In ER patient was noted to have tachycardia, leukocytosis, erythema of the right lower knee extending below the knee.  She was admitted with the following problems:  Sepsis: As per admitting physician's note patient met sepsis criteria with tachycardia, leukocytosis at the time of admission.  Likely from the lower extremity cellulitis. Blood cultures NGTD. She was given IV vancomycin.  Infectious disease was consulted and the patient switched to clindamycin.  Currently blood pressure is stable.  WBC count improving.  She is afebrile.  Right lower extremity cellulitis:  Pain/erythema of her right lower extremity below her knee improving.  Wound cultures showing MSSA.  Patient has severe allergy to penicillins.  Also allergic to Cephalexin.   Was treated with IV vancomycin.  Consulted infectious disease and they recommended to switch to oral clindamycin.  She did have mild low-grade fever last night but today she says she feels a lot better and is requesting to be discharged home. Consulted PT/OT and recommended home health, supervision/assistance 24 hours.  Case manager consulted and will help arrange home health. She will complete her treatment course with oral clindamycin as per infectious disease  recommendations.  Hypertension:  Blood pressure stable.   Patient will continue her home blood pressure medications. Advised her to follow-up outpatient with her primary care physician to have her blood pressure monitored and medications adjusted in the outpatient setting accordingly.  She did have some low-grade fever last night.  However, patient at this time says she feels a lot better and is requesting to be discharged home.  Vitals, physical exam at the time of discharge fairly stable.  Discharge Diagnoses:  Principal Problem:   Cellulitis and abscess of right lower extremity Active Problems:   Sepsis ([place])   Cellulitis of left lower extremity   Normocytic normochromic anemia   Essential hypertension  Discharge Instructions Discharge plan of care discussed with the patient as well as her husband at the bedside.  I answered all of their questions appropriately to the best of my knowledge.  Advised the patient to follow-up with her outpatient physician.  They verbalized clear understanding and the patient is being discharged home in stable condition   Allergies as of 02/11/2017      Reactions   Aspirin Shortness Of Breath   respiratory distress   Codeine Nausea Only   Peanut (diagnostic) Anaphylaxis   hives and wheezing, anaphylaxis   Penicillins Anaphylaxis   Has patient had a PCN reaction causing immediate rash, facial/tongue/throat swelling, SOB or lightheadedness with hypotension: Yes Has patient had a PCN reaction causing severe rash involving mucus membranes or skin necrosis: No Has patient had a PCN reaction that required hospitalization Yes Has patient had a PCN reaction occurring within the last 10 years: No If all of the above answers are "NO", then may proceed with Cephalosporin use.   Shellfish Allergy Anaphylaxis   Respiratory arrest   Tetracycline  Anaphylaxis   Respiratory arrest   Cephalexin Hives   Iodinated Diagnostic Agents Hives   Latex Hives   Morphine  Other (See Comments)   Possible resp arrest?? Patient is unsure   Nitrofuran Derivatives Other (See Comments)   Unknown by patient   Sulfa Antibiotics Hives      Medication List    TAKE these medications   acetaminophen 500 MG tablet Commonly known as:  TYLENOL Take 1,000 mg by mouth every 6 (six) hours as needed for moderate pain.   PROVENTIL HFA 108 (90 Base) MCG/ACT inhaler Generic drug:  albuterol Inhale 2 puffs into the lungs every 6 (six) hours as needed for wheezing or shortness of breath.   albuterol (2.5 MG/3ML) 0.083% nebulizer solution Commonly known as:  PROVENTIL Take 2.5 mg by nebulization every 6 (six) hours as needed for wheezing or shortness of breath.   calcium carbonate 500 MG chewable tablet Commonly known as:  TUMS - dosed in mg elemental calcium Chew 1 tablet by mouth daily.   chlorthalidone 25 MG tablet Commonly known as:  HYGROTON Take 25 mg by mouth daily.   clindamycin 150 MG capsule Commonly known as:  CLEOCIN Take 3 capsules (450 mg total) by mouth every 8 (eight) hours for 4 days.   clonazePAM 0.5 MG tablet Commonly known as:  KLONOPIN Take 0.5 mg by mouth 2 (two) times daily as needed for anxiety.   cloNIDine 0.2 mg/24hr patch Commonly known as:  CATAPRES - Dosed in mg/24 hr Place 0.2 mg onto the skin once a week.   diphenhydrAMINE 25 mg capsule Commonly known as:  BENADRYL Take 25 mg by mouth every 6 (six) hours as needed for allergies.   divalproex 500 MG 24 hr tablet Commonly known as:  DEPAKOTE ER Take 500 mg by mouth daily.   EPIPEN 2-PAK 0.3 mg/0.3 mL Soaj injection Generic drug:  EPINEPHrine Inject 0.3 mg into the muscle once. For anaphylaxis   ferrous sulfate 325 (65 FE) MG tablet Take 325 mg by mouth daily with breakfast.   furosemide 40 MG tablet Commonly known as:  LASIX Take 40 mg by mouth daily as needed for fluid or edema.   HYDROcodone-acetaminophen 10-325 MG tablet Commonly known as:  NORCO Take 1 tablet by  mouth every 6 (six) hours as needed for moderate pain.   isosorbide dinitrate 30 MG tablet Commonly known as:  ISORDIL Take 30 mg by mouth at bedtime.   lansoprazole 30 MG capsule Commonly known as:  PREVACID Take 30 mg by mouth daily as needed (for heartburn).   Melatonin 3 MG Tabs Take 3 mg by mouth at bedtime.   metoprolol tartrate 50 MG tablet Commonly known as:  LOPRESSOR Take 1 tablet (50 mg total) by mouth 2 (two) times daily. What changed:    medication strength  how much to take   NIFEdipine 60 MG 24 hr tablet Commonly known as:  PROCARDIA-XL/ADALAT CC Take 60 mg by mouth daily.   oxyCODONE 5 MG immediate release tablet Commonly known as:  Oxy IR/ROXICODONE Take 5 mg by mouth every 6 (six) hours as needed for severe pain.   potassium chloride SA 20 MEQ tablet Commonly known as:  K-DUR,KLOR-CON Take 1 tablet (20 mEq total) by mouth daily. Take only when taking the Lasix What changed:  additional instructions   traZODone 50 MG tablet Commonly known as:  DESYREL Take 50 mg by mouth at bedtime.   Vitamin D (Ergocalciferol) 50000 units Caps capsule Commonly known as:  DRISDOL Take 50,000 Units by mouth every Monday, Wednesday, and Friday.   Vitamin D3 3000 units Tabs Take 1 tablet by mouth daily.      Follow-up Information    [facility].   Why:  They will do your home health care at your home Contact information: [place] [address] tele # [phone]         Allergies  Allergen Reactions  . Aspirin Shortness Of Breath    respiratory distress  . Codeine Nausea Only  . Peanut (Diagnostic) Anaphylaxis    hives and wheezing, anaphylaxis  . Penicillins Anaphylaxis    Has patient had a PCN reaction causing immediate rash, facial/tongue/throat swelling, SOB or lightheadedness with hypotension: Yes Has patient had a PCN reaction causing severe rash involving mucus membranes or skin necrosis: No Has patient had a PCN reaction  that required hospitalization Yes Has patient had a PCN reaction occurring within the last 10 years: No If all of the above answers are "NO", then may proceed with Cephalosporin use.   . Shellfish Allergy Anaphylaxis    Respiratory arrest  . Tetracycline Anaphylaxis    Respiratory arrest  . Cephalexin Hives  . Iodinated Diagnostic Agents Hives  . Latex Hives  . Morphine Other (See Comments)    Possible resp arrest?? Patient is unsure  . Nitrofuran Derivatives Other (See Comments)    Unknown by patient  . Sulfa Antibiotics Hives    Consultations: Orthopedics Infectious disease   Procedures/Studies: Ct Knee Right Wo Contrast  Result Date: 02/07/2017 CLINICAL DATA:  Acute onset of right knee drainage since injection on December 21st. Right knee pain. EXAM: CT OF THE RIGHT KNEE WITHOUT CONTRAST CT OF THE RIGHT TIBIA FIBULA WITHOUT CONTRAST TECHNIQUE: Multidetector CT imaging of the mid to distal right lower extremity, including the right knee and the right tibia/fibula, was performed according to the standard protocol. COMPARISON:  Right knee radiographs performed 02/06/2017 FINDINGS: Bones/Joint/Cartilage There is no evidence of osseous erosion on CT. There is no evidence of fracture or dislocation. There is mild calcification along the articular cartilage of the patella. There is chronic flattening and depression of the lateral tibial plateau, with diffuse sclerosis at the lateral compartment. A subcortical cyst is noted at the tibial spine. Prominent marginal osteophytes are seen at all 3 compartments. Wall osteophytes are noted at the intercondylar notch. Prominent loose bodies are seen about the anterior, posterior and lateral aspects of the knee. Underlying diffuse osteopenia is noted. A relatively large complex knee joint effusion is noted, with diffuse peripheral soft tissue thickening. This raises concern for septic joint effusion. Ligaments Suboptimally assessed by CT. Muscles and  Tendons The quadriceps and patellar tendons appear intact. The musculature of the right lower leg appears intact. Soft tissues Diffuse soft tissue edema is noted about the knee, and tracking inferiorly along the right lower leg to the level of the ankle. Note is made of focal soft tissue disruption medial to the proximal right tibia, with trace underlying soft tissue air extending superiorly. Would correlate clinically to exclude infection with a gas producing organism. No well defined fluid collection is seen to suggest abscess at this time. There is diffuse calcification of the menisci. IMPRESSION: 1. Relatively large complex knee joint effusion, with diffuse peripheral soft tissue thickening. This is concerning for septic joint effusion. Would correlate with the patient's symptoms. 2. Focal soft tissue disruption medial to the proximal right tibia, with trace underlying soft tissue air extending superiorly. Would  correlate clinically to exclude infection with a gas producing organism. No evidence of underlying abscess. 3. No evidence of osseous erosion on CT. Chronic flattening and depression of the lateral tibial plateau, with tricompartmental osteoarthritis. Prominent loose bodies noted about the knee. 4. Diffuse soft tissue edema noted about the knee, and tracking inferiorly along the right lower leg to the level of the ankle. 5. Underlying diffuse osteopenia. These results were called by telephone at the time of interpretation on 02/07/2017 at 6:05 am to the ER clinical team, who verbally acknowledged these results. Electronically Signed   By: Garald Balding M.D.   On: 02/07/2017 06:07   Ct Tibia Fibula Right Wo Contrast  Result Date: 02/07/2017 CLINICAL DATA:  Acute onset of right knee drainage since injection on December 21st. Right knee pain. EXAM: CT OF THE RIGHT KNEE WITHOUT CONTRAST CT OF THE RIGHT TIBIA FIBULA WITHOUT CONTRAST TECHNIQUE: Multidetector CT imaging of the mid to distal right lower  extremity, including the right knee and the right tibia/fibula, was performed according to the standard protocol. COMPARISON:  Right knee radiographs performed 02/06/2017 FINDINGS: Bones/Joint/Cartilage There is no evidence of osseous erosion on CT. There is no evidence of fracture or dislocation. There is mild calcification along the articular cartilage of the patella. There is chronic flattening and depression of the lateral tibial plateau, with diffuse sclerosis at the lateral compartment. A subcortical cyst is noted at the tibial spine. Prominent marginal osteophytes are seen at all 3 compartments. Wall osteophytes are noted at the intercondylar notch. Prominent loose bodies are seen about the anterior, posterior and lateral aspects of the knee. Underlying diffuse osteopenia is noted. A relatively large complex knee joint effusion is noted, with diffuse peripheral soft tissue thickening. This raises concern for septic joint effusion. Ligaments Suboptimally assessed by CT. Muscles and Tendons The quadriceps and patellar tendons appear intact. The musculature of the right lower leg appears intact. Soft tissues Diffuse soft tissue edema is noted about the knee, and tracking inferiorly along the right lower leg to the level of the ankle. Note is made of focal soft tissue disruption medial to the proximal right tibia, with trace underlying soft tissue air extending superiorly. Would correlate clinically to exclude infection with a gas producing organism. No well defined fluid collection is seen to suggest abscess at this time. There is diffuse calcification of the menisci. IMPRESSION: 1. Relatively large complex knee joint effusion, with diffuse peripheral soft tissue thickening. This is concerning for septic joint effusion. Would correlate with the patient's symptoms. 2. Focal soft tissue disruption medial to the proximal right tibia, with trace underlying soft tissue air extending superiorly. Would correlate  clinically to exclude infection with a gas producing organism. No evidence of underlying abscess. 3. No evidence of osseous erosion on CT. Chronic flattening and depression of the lateral tibial plateau, with tricompartmental osteoarthritis. Prominent loose bodies noted about the knee. 4. Diffuse soft tissue edema noted about the knee, and tracking inferiorly along the right lower leg to the level of the ankle. 5. Underlying diffuse osteopenia. These results were called by telephone at the time of interpretation on 02/07/2017 at 6:05 am to the ER clinical team, who verbally acknowledged these results. Electronically Signed   By: Garald Balding M.D.   On: 02/07/2017 06:07   Dg Knee Complete 4 Views Right  Result Date: 02/06/2017 CLINICAL DATA:  Patient had a right knee injection December 21st now with pain and drainage. EXAM: RIGHT KNEE - COMPLETE 4+ VIEW COMPARISON:  September 24, 2014 FINDINGS: No evidence of fracture, dislocation. There is a small suprapatellar effusion. Severe osteoarthritic changes of the right knee are noted. Soft tissues are unremarkable. IMPRESSION: No acute abnormality.  Severe osteoarthritic changes of right knee. Electronically Signed   By: Abelardo Diesel M.D.   On: 02/06/2017 20:07    (Echo, Carotid, EGD, Colonoscopy, ERCP)    Subjective:   Discharge Exam: Vitals:   02/11/17 1100 02/11/17 1137  BP:  (!) 148/77  Pulse:  91  Resp:  20  Temp: 98.4 F (36.9 C) 99 F (37.2 C)  SpO2:  98%   Vitals:   02/11/17 0601 02/11/17 0800 02/11/17 1100 02/11/17 1137  BP: (!) 155/80 (!) 164/92  (!) 148/77  Pulse: 87 79  91  Resp: _0 Temp: 98.7 F (37.1 C) 99.3 F (37.4 C) 98.4 F (36.9 C) 99 F (37.2 C)  TempSrc: Oral Oral Oral Oral  SpO2: 96% 97%  98%  Weight: 61.4 kg (135 lb 4.8 oz)     Height:        General: Pt is alert, awake, not in acute distress Cardiovascular: RRR, S1/S2 +, no rubs, no gallops Respiratory: CTA bilaterally, no wheezing, no  rhonchi Abdominal: Soft, NT, ND, bowel sounds + Extremities: no edema, no cyanosis    The results of significant diagnostics from this hospitalization (including imaging, microbiology, ancillary and laboratory) are listed below for reference.     Microbiology: Recent Results (from the past 240 hour(s))  Wound or Superficial Culture     Status: None   Collection Time: 02/07/17  4:14 AM  Result Value Ref Range Status   Specimen Description ABSCESS  Final   Special Requests LEFT LOWER EXTREMITY  Final   Gram Stain   Final    RARE WBC PRESENT, PREDOMINANTLY PMN FEW GRAM POSITIVE COCCI IN PAIRS IN CLUSTERS    Culture MODERATE STAPHYLOCOCCUS AUREUS  Final   Report Status 02/09/2017 FINAL  Final   Organism ID, Bacteria STAPHYLOCOCCUS AUREUS  Final      Susceptibility   Staphylococcus aureus - MIC*    CIPROFLOXACIN <=0.5 SENSITIVE Sensitive     ERYTHROMYCIN 0.5 SENSITIVE Sensitive     GENTAMICIN <=0.5 SENSITIVE Sensitive     OXACILLIN 0.5 SENSITIVE Sensitive     TETRACYCLINE <=1 SENSITIVE Sensitive     VANCOMYCIN 1 SENSITIVE Sensitive     TRIMETH/SULFA <=10 SENSITIVE Sensitive     CLINDAMYCIN <=0.25 SENSITIVE Sensitive     RIFAMPIN <=0.5 SENSITIVE Sensitive     Inducible Clindamycin NEGATIVE Sensitive     * MODERATE STAPHYLOCOCCUS AUREUS  Culture, blood (routine x 2)     Status: None (Preliminary result)   Collection Time: 02/07/17  9:10 AM  Result Value Ref Range Status   Specimen Description BLOOD RIGHT HAND  Final   Special Requests IN PEDIATRIC BOTTLE Blood Culture adequate volume  Final   Culture NO GROWTH 4 DAYS  Final   Report Status PENDING  Incomplete  Culture, blood (routine x 2)     Status: None (Preliminary result)   Collection Time: 02/07/17  9:20 AM  Result Value Ref Range Status   Specimen Description BLOOD LEFT HAND  Final   Special Requests IN PEDIATRIC BOTTLE Blood Culture adequate volume  Final   Culture NO GROWTH 4 DAYS  Final   Report Status PENDING   Incomplete  MRSA PCR Screening     Status: None   Collection Time: 02/08/17  6:06 AM  Result  Value Ref Range Status   MRSA by PCR NEGATIVE NEGATIVE Final    Comment:        The GeneXpert MRSA Assay (FDA approved for NASAL specimens only), is one component of a comprehensive MRSA colonization surveillance program. It is not intended to diagnose MRSA infection nor to guide or monitor treatment for MRSA infections.   Aerobic Culture (superficial specimen)     Status: None   Collection Time: 02/08/17  1:56 PM  Result Value Ref Range Status   Specimen Description ABSCESS  Final   Special Requests Normal  Final   Gram Stain   Final    NO WBC SEEN RARE SQUAMOUS EPITHELIAL CELLS PRESENT MODERATE GRAM POSITIVE COCCI IN CLUSTERS    Culture ABUNDANT STAPHYLOCOCCUS AUREUS  Final   Report Status 02/11/2017 FINAL  Final   Organism ID, Bacteria STAPHYLOCOCCUS AUREUS  Final      Susceptibility   Staphylococcus aureus - MIC*    CIPROFLOXACIN <=0.5 SENSITIVE Sensitive     ERYTHROMYCIN <=0.25 SENSITIVE Sensitive     GENTAMICIN <=0.5 SENSITIVE Sensitive     OXACILLIN <=0.25 SENSITIVE Sensitive     TETRACYCLINE <=1 SENSITIVE Sensitive     VANCOMYCIN <=0.5 SENSITIVE Sensitive     TRIMETH/SULFA <=10 SENSITIVE Sensitive     CLINDAMYCIN <=0.25 SENSITIVE Sensitive     RIFAMPIN <=0.5 SENSITIVE Sensitive     Inducible Clindamycin NEGATIVE Sensitive     * ABUNDANT STAPHYLOCOCCUS AUREUS     Labs: BNP (last 3 results) No results for input(s): BNP in the last 8760 hours. Basic Metabolic Panel: Recent Labs  Lab 02/07/17 0512 02/08/17 0422 02/09/17 1356 02/10/17 0922 02/11/17 0413  NA 133* 136 137 137 135  K 3.9 3.9 5.0 4.0 3.9  CL 103 104 109 107 104  CO2 22 23 18* 20* 21*  GLUCOSE 138* 130* 95 117* 107*  BUN 22* 17 27* 31* 22*  CREATININE 1.02* 0.89 1.08* 1.16* 1.05*  CALCIUM 9.0 8.7* 8.9 8.6* 8.6*   Liver Function Tests: Recent Labs  Lab 02/08/17 0422  AST 18  ALT 18  ALKPHOS  115  BILITOT 0.6  PROT 6.3*  ALBUMIN 2.8*   No results for input(s): LIPASE, AMYLASE in the last 168 hours. No results for input(s): AMMONIA in the last 168 hours. CBC: Recent Labs  Lab 02/07/17 0128  02/08/17 0422 02/09/17 0514 02/09/17 1356 02/10/17 0922 02/11/17 0413  WBC 21.5*   < > 18.2* 14.4* 14.9* 12.0* 11.5*  NEUTROABS 18.1*  --   --   --  10.0*  --   --   HGB 10.9*   < > 10.2* 8.9* 9.6* 9.3* 8.6*  HCT 35.0*   < > 33.5* 29.4* 29.9* 30.4* 28.7*  MCV 77.3*   < > 78.6 76.6* 75.7* 77.0* 76.7*  PLT 374   < > 422* 367 359 397 374   < > = values in this interval not displayed.   Cardiac Enzymes: No results for input(s): CKTOTAL, CKMB, CKMBINDEX, TROPONINI in the last 168 hours. BNP: Invalid input(s): POCBNP CBG: No results for input(s): GLUCAP in the last 168 hours. D-Dimer No results for input(s): DDIMER in the last 72 hours. Hgb A1c No results for input(s): HGBA1C in the last 72 hours. Lipid Profile No results for input(s): CHOL, HDL, LDLCALC, TRIG, CHOLHDL, LDLDIRECT in the last 72 hours. Thyroid function studies No results for input(s): TSH, T4TOTAL, T3FREE, THYROIDAB in the last 72 hours.  Invalid input(s): FREET3 Anemia work up No results for  input(s): VITAMINB12, FOLATE, FERRITIN, TIBC, IRON, RETICCTPCT in the last 72 hours. Urinalysis No results found for: COLORURINE, APPEARANCEUR, LABSPEC, PHURINE, GLUCOSEU, HGBUR, BILIRUBINUR, KETONESUR, PROTEINUR, UROBILINOGEN, NITRITE, LEUKOCYTESUR Sepsis Labs Invalid input(s): PROCALCITONIN,  WBC,  LACTICIDVEN Microbiology Recent Results (from the past 240 hour(s))  Wound or Superficial Culture     Status: None   Collection Time: 02/07/17  4:14 AM  Result Value Ref Range Status   Specimen Description ABSCESS  Final   Special Requests LEFT LOWER EXTREMITY  Final   Gram Stain   Final    RARE WBC PRESENT, PREDOMINANTLY PMN FEW GRAM POSITIVE COCCI IN PAIRS IN CLUSTERS    Culture MODERATE STAPHYLOCOCCUS AUREUS  Final    Report Status 02/09/2017 FINAL  Final   Organism ID, Bacteria STAPHYLOCOCCUS AUREUS  Final      Susceptibility   Staphylococcus aureus - MIC*    CIPROFLOXACIN <=0.5 SENSITIVE Sensitive     ERYTHROMYCIN 0.5 SENSITIVE Sensitive     GENTAMICIN <=0.5 SENSITIVE Sensitive     OXACILLIN 0.5 SENSITIVE Sensitive     TETRACYCLINE <=1 SENSITIVE Sensitive     VANCOMYCIN 1 SENSITIVE Sensitive     TRIMETH/SULFA <=10 SENSITIVE Sensitive     CLINDAMYCIN <=0.25 SENSITIVE Sensitive     RIFAMPIN <=0.5 SENSITIVE Sensitive     Inducible Clindamycin NEGATIVE Sensitive     * MODERATE STAPHYLOCOCCUS AUREUS  Culture, blood (routine x 2)     Status: None (Preliminary result)   Collection Time: 02/07/17  9:10 AM  Result Value Ref Range Status   Specimen Description BLOOD RIGHT HAND  Final   Special Requests IN PEDIATRIC BOTTLE Blood Culture adequate volume  Final   Culture NO GROWTH 4 DAYS  Final   Report Status PENDING  Incomplete  Culture, blood (routine x 2)     Status: None (Preliminary result)   Collection Time: 02/07/17  9:20 AM  Result Value Ref Range Status   Specimen Description BLOOD LEFT HAND  Final   Special Requests IN PEDIATRIC BOTTLE Blood Culture adequate volume  Final   Culture NO GROWTH 4 DAYS  Final   Report Status PENDING  Incomplete  MRSA PCR Screening     Status: None   Collection Time: 02/08/17  6:06 AM  Result Value Ref Range Status   MRSA by PCR NEGATIVE NEGATIVE Final    Comment:        The GeneXpert MRSA Assay (FDA approved for NASAL specimens only), is one component of a comprehensive MRSA colonization surveillance program. It is not intended to diagnose MRSA infection nor to guide or monitor treatment for MRSA infections.   Aerobic Culture (superficial specimen)     Status: None   Collection Time: 02/08/17  1:56 PM  Result Value Ref Range Status   Specimen Description ABSCESS  Final   Special Requests Normal  Final   Gram Stain   Final    NO WBC SEEN RARE  SQUAMOUS EPITHELIAL CELLS PRESENT MODERATE GRAM POSITIVE COCCI IN CLUSTERS    Culture ABUNDANT STAPHYLOCOCCUS AUREUS  Final   Report Status 02/11/2017 FINAL  Final   Organism ID, Bacteria STAPHYLOCOCCUS AUREUS  Final      Susceptibility   Staphylococcus aureus - MIC*    CIPROFLOXACIN <=0.5 SENSITIVE Sensitive     ERYTHROMYCIN <=0.25 SENSITIVE Sensitive     GENTAMICIN <=0.5 SENSITIVE Sensitive     OXACILLIN <=0.25 SENSITIVE Sensitive     TETRACYCLINE <=1 SENSITIVE Sensitive     VANCOMYCIN <=0.5 SENSITIVE Sensitive  TRIMETH/SULFA <=10 SENSITIVE Sensitive     CLINDAMYCIN <=0.25 SENSITIVE Sensitive     RIFAMPIN <=0.5 SENSITIVE Sensitive     Inducible Clindamycin NEGATIVE Sensitive     * ABUNDANT STAPHYLOCOCCUS AUREUS     Time coordinating discharge: Over 30 minutes  SIGNED:   Beverely Pace, MD  [facility] 02/11/2017, 12:50 PM Pager   If 7PM-7AM, please contact night-coverage [email or web address] Password [email or web address]

## 2017-02-11 NOTE — Progress Notes (Signed)
RN changed dressing on pt's right leg.

## 2017-02-12 LAB — CULTURE, BLOOD (ROUTINE X 2)
CULTURE: NO GROWTH
Culture: NO GROWTH
Special Requests: ADEQUATE
Special Requests: ADEQUATE

## 2017-03-23 ENCOUNTER — Encounter (HOSPITAL_BASED_OUTPATIENT_CLINIC_OR_DEPARTMENT_OTHER): Payer: Medicare Other | Attending: Physician Assistant

## 2017-03-23 DIAGNOSIS — I10 Essential (primary) hypertension: Secondary | ICD-10-CM | POA: Insufficient documentation

## 2017-03-23 DIAGNOSIS — Z8673 Personal history of transient ischemic attack (TIA), and cerebral infarction without residual deficits: Secondary | ICD-10-CM | POA: Insufficient documentation

## 2017-03-23 DIAGNOSIS — L03115 Cellulitis of right lower limb: Secondary | ICD-10-CM | POA: Insufficient documentation

## 2017-03-23 DIAGNOSIS — Z87891 Personal history of nicotine dependence: Secondary | ICD-10-CM | POA: Insufficient documentation

## 2017-03-23 DIAGNOSIS — L97812 Non-pressure chronic ulcer of other part of right lower leg with fat layer exposed: Secondary | ICD-10-CM | POA: Insufficient documentation

## 2017-03-28 ENCOUNTER — Other Ambulatory Visit: Payer: Self-pay | Admitting: Physician Assistant

## 2017-03-28 ENCOUNTER — Ambulatory Visit (HOSPITAL_COMMUNITY)
Admission: RE | Admit: 2017-03-28 | Discharge: 2017-03-28 | Disposition: A | Discharge status: Home / Self Care | Payer: Medicare Other | Source: Ambulatory Visit | Attending: Physician Assistant | Admitting: Physician Assistant

## 2017-03-28 ENCOUNTER — Other Ambulatory Visit (HOSPITAL_COMMUNITY)
Admission: RE | Admit: 2017-03-28 | Discharge: 2017-03-28 | Disposition: A | Discharge status: Home / Self Care | Payer: Medicare Other | Source: Other Acute Inpatient Hospital | Attending: Physician Assistant | Admitting: Physician Assistant

## 2017-03-28 DIAGNOSIS — X58XXXA Exposure to other specified factors, initial encounter: Secondary | ICD-10-CM | POA: Insufficient documentation

## 2017-03-28 DIAGNOSIS — M7989 Other specified soft tissue disorders: Secondary | ICD-10-CM | POA: Insufficient documentation

## 2017-03-28 DIAGNOSIS — L03115 Cellulitis of right lower limb: Secondary | ICD-10-CM | POA: Diagnosis not present

## 2017-03-28 DIAGNOSIS — S81801A Unspecified open wound, right lower leg, initial encounter: Secondary | ICD-10-CM

## 2017-03-28 DIAGNOSIS — S81802A Unspecified open wound, left lower leg, initial encounter: Secondary | ICD-10-CM | POA: Insufficient documentation

## 2017-03-28 DIAGNOSIS — Z87891 Personal history of nicotine dependence: Secondary | ICD-10-CM | POA: Diagnosis not present

## 2017-03-28 DIAGNOSIS — M25461 Effusion, right knee: Secondary | ICD-10-CM | POA: Insufficient documentation

## 2017-03-28 DIAGNOSIS — M11261 Other chondrocalcinosis, right knee: Secondary | ICD-10-CM | POA: Insufficient documentation

## 2017-03-28 DIAGNOSIS — L97812 Non-pressure chronic ulcer of other part of right lower leg with fat layer exposed: Secondary | ICD-10-CM | POA: Diagnosis not present

## 2017-03-28 DIAGNOSIS — Z8673 Personal history of transient ischemic attack (TIA), and cerebral infarction without residual deficits: Secondary | ICD-10-CM | POA: Diagnosis not present

## 2017-03-28 DIAGNOSIS — I10 Essential (primary) hypertension: Secondary | ICD-10-CM | POA: Diagnosis not present

## 2017-04-01 LAB — AEROBIC CULTURE W GRAM STAIN (SUPERFICIAL SPECIMEN)

## 2017-04-01 LAB — AEROBIC CULTURE  (SUPERFICIAL SPECIMEN)

## 2017-04-04 DIAGNOSIS — L97812 Non-pressure chronic ulcer of other part of right lower leg with fat layer exposed: Secondary | ICD-10-CM | POA: Diagnosis not present

## 2017-04-10 ENCOUNTER — Ambulatory Visit (HOSPITAL_COMMUNITY)
Admission: RE | Admit: 2017-04-10 | Discharge: 2017-04-10 | Disposition: A | Discharge status: Home / Self Care | Payer: Medicare Other | Source: Ambulatory Visit | Attending: Internal Medicine | Admitting: Internal Medicine

## 2017-04-10 ENCOUNTER — Other Ambulatory Visit: Payer: Self-pay | Admitting: Physician Assistant

## 2017-04-10 DIAGNOSIS — L97919 Non-pressure chronic ulcer of unspecified part of right lower leg with unspecified severity: Secondary | ICD-10-CM | POA: Diagnosis present

## 2017-04-10 DIAGNOSIS — I10 Essential (primary) hypertension: Secondary | ICD-10-CM | POA: Diagnosis not present

## 2017-04-11 ENCOUNTER — Encounter (HOSPITAL_BASED_OUTPATIENT_CLINIC_OR_DEPARTMENT_OTHER): Payer: Medicare Other | Attending: Physician Assistant

## 2017-04-11 DIAGNOSIS — I10 Essential (primary) hypertension: Secondary | ICD-10-CM | POA: Insufficient documentation

## 2017-04-11 DIAGNOSIS — Z8673 Personal history of transient ischemic attack (TIA), and cerebral infarction without residual deficits: Secondary | ICD-10-CM | POA: Diagnosis not present

## 2017-04-11 DIAGNOSIS — L97812 Non-pressure chronic ulcer of other part of right lower leg with fat layer exposed: Secondary | ICD-10-CM | POA: Insufficient documentation

## 2017-04-11 DIAGNOSIS — L03115 Cellulitis of right lower limb: Secondary | ICD-10-CM | POA: Diagnosis not present

## 2017-04-11 DIAGNOSIS — Z87891 Personal history of nicotine dependence: Secondary | ICD-10-CM | POA: Diagnosis not present

## 2017-04-12 ENCOUNTER — Encounter (HOSPITAL_COMMUNITY): Payer: Self-pay | Admitting: Emergency Medicine

## 2017-04-12 ENCOUNTER — Emergency Department (HOSPITAL_COMMUNITY)
Admission: EM | Admit: 2017-04-12 | Discharge: 2017-04-13 | Disposition: A | Discharge status: Home / Self Care | Payer: Medicare Other | Attending: Emergency Medicine | Admitting: Emergency Medicine

## 2017-04-12 DIAGNOSIS — Z91040 Latex allergy status: Secondary | ICD-10-CM | POA: Insufficient documentation

## 2017-04-12 DIAGNOSIS — T148XXA Other injury of unspecified body region, initial encounter: Secondary | ICD-10-CM

## 2017-04-12 DIAGNOSIS — M1711 Unilateral primary osteoarthritis, right knee: Secondary | ICD-10-CM | POA: Insufficient documentation

## 2017-04-12 DIAGNOSIS — L089 Local infection of the skin and subcutaneous tissue, unspecified: Secondary | ICD-10-CM

## 2017-04-12 DIAGNOSIS — T8141XA Infection following a procedure, superficial incisional surgical site, initial encounter: Secondary | ICD-10-CM | POA: Diagnosis not present

## 2017-04-12 DIAGNOSIS — I10 Essential (primary) hypertension: Secondary | ICD-10-CM | POA: Diagnosis not present

## 2017-04-12 DIAGNOSIS — L03115 Cellulitis of right lower limb: Secondary | ICD-10-CM | POA: Diagnosis present

## 2017-04-12 DIAGNOSIS — Z79899 Other long term (current) drug therapy: Secondary | ICD-10-CM | POA: Diagnosis not present

## 2017-04-12 DIAGNOSIS — Y829 Unspecified medical devices associated with adverse incidents: Secondary | ICD-10-CM | POA: Insufficient documentation

## 2017-04-12 LAB — COMPREHENSIVE METABOLIC PANEL
ALBUMIN: 4.2 g/dL (ref 3.5–5.0)
ALK PHOS: 72 U/L (ref 38–126)
ALT: 12 U/L — AB (ref 14–54)
ANION GAP: 9 (ref 5–15)
AST: 19 U/L (ref 15–41)
BILIRUBIN TOTAL: 0.2 mg/dL — AB (ref 0.3–1.2)
BUN: 15 mg/dL (ref 6–20)
CALCIUM: 9.6 mg/dL (ref 8.9–10.3)
CO2: 26 mmol/L (ref 22–32)
CREATININE: 0.94 mg/dL (ref 0.44–1.00)
Chloride: 106 mmol/L (ref 101–111)
GFR calc Af Amer: >60 mL/min (ref >60)
GFR calc non Af Amer: 60 mL/min — ABNORMAL LOW (ref >60)
GLUCOSE: 114 mg/dL — AB (ref 65–99)
Potassium: 3.6 mmol/L (ref 3.5–5.1)
Sodium: 141 mmol/L (ref 135–145)
Total Protein: 8.1 g/dL (ref 6.5–8.1)

## 2017-04-12 LAB — CBC WITH DIFFERENTIAL/PLATELET
BASOS PCT: 0 %
Basophils Absolute: 0 10*3/uL (ref 0.0–0.1)
EOS PCT: 7 %
Eosinophils Absolute: 0.8 10*3/uL — ABNORMAL HIGH (ref 0.0–0.7)
HEMATOCRIT: 36.6 % (ref 36.0–46.0)
Hemoglobin: 11.2 g/dL — ABNORMAL LOW (ref 12.0–15.0)
Lymphocytes Relative: 28 %
Lymphs Abs: 2.9 10*3/uL (ref 0.7–4.0)
MCH: 23.2 pg — ABNORMAL LOW (ref 26.0–34.0)
MCHC: 30.6 g/dL (ref 30.0–36.0)
MCV: 75.9 fL — ABNORMAL LOW (ref 78.0–100.0)
MONO ABS: 0.6 10*3/uL (ref 0.1–1.0)
MONOS PCT: 6 %
NEUTROS ABS: 6.1 10*3/uL (ref 1.7–7.7)
Neutrophils Relative %: 59 %
PLATELETS: 417 10*3/uL — AB (ref 150–400)
RBC: 4.82 MIL/uL (ref 3.87–5.11)
RDW: 16.1 % — AB (ref 11.5–15.5)
WBC: 10.4 10*3/uL (ref 4.0–10.5)

## 2017-04-12 LAB — I-STAT CG4 LACTIC ACID, ED: Lactic Acid, Venous: 1.26 mmol/L (ref 0.5–1.9)

## 2017-04-12 MED ORDER — VANCOMYCIN HCL IN DEXTROSE 1-5 GM/200ML-% IV SOLN
1000.0000 mg | Freq: Once | INTRAVENOUS | Status: AC
  Administered 2017-04-12: 1000 mg via INTRAVENOUS
  Filled 2017-04-12: qty 200

## 2017-04-12 NOTE — ED Provider Notes (Signed)
[facility] DEPT Provider Note   CSN: [id number] Arrival date & time: 04/12/17  1517   History   Chief Complaint Chief Complaint  Patient presents with  . Wound Infection    HPI Tamara Howe is a 72 y.o. female.  HPI Patient presents to the emergency room for evaluation of a chronic wound in her right leg.  Patient has history of severe arthritis.  She is not a candidate for surgery.  Patient had a knee injection back in December.  She ended up developing an abscess and cellulitis.  Patient's had a persistent wound below her knee and has been getting followed at the wound clinic.  They were concerned that today it appeared more erythematous.  She has finished a round of antibiotics.  She was told to come to the ED to consider IV antibiotic treatment. Past Medical History:  Diagnosis Date  . Abdominal mass, LLQ (left lower quadrant)   . Anemia   . Anxiety   . Asthma   . Bladder infection, chronic   . Cellulitis and abscess of leg 02/08/2017  . Chest pain   . Chronic pain   . Complication of anesthesia    "stopped breathing with tumor removal surgery, not happened since then"  . Constipation   . Cough   . Depression   . Dietary counseling   . Dysuria   . Edema extremities   . Enthesopathy of knee   . Esophagitis   . GERD (gastroesophageal reflux disease)   . Headache   . Heart murmur   . High risk medication use   . History of bronchitis   . Hypercholesterolemia   . Hypertension   . Insomnia   . 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 Lipoma of skin   . Low back pain   . Malaise and fatigue   . Nocturia   . Noncompliance with medications   . Osteoarthritis   . PONV (postoperative nausea and vomiting)   . Prediabetes   . Rash   . Shortness of breath dyspnea   . Vitamin D deficiency     Patient Active Problem List   Diagnosis Date Noted  . Cellulitis and abscess of right lower extremity   . Sepsis ([place])  02/07/2017  . Cellulitis of left lower extremity 02/07/2017  . Normocytic normochromic anemia 02/07/2017  . Essential hypertension 02/07/2017    Past Surgical History:  Procedure Laterality Date  . ABDOMINAL HYSTERECTOMY    . ANKLE SURGERY Right   . APPENDECTOMY    . CARDIAC CATHETERIZATION     12/13/12 [facility], Dr. Gibson Ramp): 30-40% mid LAD.  Marland Kitchen SALIVARY GLAND SURGERY    . TUMOR EXCISION      OB History    No data available       Home Medications    Prior to Admission medications   Medication Sig Start Date End Date Taking? Authorizing Provider  acetaminophen (TYLENOL) 500 MG tablet Take 1,000 mg by mouth every 6 (six) hours as needed for moderate pain.   Yes [provider]  albuterol (PROVENTIL HFA) 108 (90 Base) MCG/ACT inhaler Inhale 2 puffs into the lungs every 6 (six) hours as needed for wheezing or shortness of breath.   Yes [provider]  albuterol (PROVENTIL) (2.5 MG/3ML) 0.083% nebulizer solution Take 2.5 mg by nebulization every 6 (six) hours as needed for wheezing or shortness of breath.   Yes [provider]  atorvastatin (LIPITOR) 40 MG tablet  Take 40 mg by mouth daily.   Yes [provider]  chlorthalidone (HYGROTON) 25 MG tablet Take 25 mg by mouth daily.   Yes [provider]  clonazePAM (KLONOPIN) 0.5 MG tablet Take 0.5 mg by mouth 2 (two) times daily as needed for anxiety.   Yes [provider]  cloNIDine (CATAPRES - DOSED IN MG/24 HR) 0.2 mg/24hr patch Place 0.2 mg onto the skin once a week.   Yes [provider]  divalproex (DEPAKOTE ER) 500 MG 24 hr tablet Take 500 mg by mouth daily.   Yes [provider]  EPINEPHrine (EPIPEN 2-PAK) 0.3 mg/0.3 mL IJ SOAJ injection Inject 0.3 mg into the muscle once. For anaphylaxis   Yes [provider]  ferrous sulfate 325 (65 FE) MG tablet Take 325 mg by mouth daily with breakfast.   Yes [provider]  hydrALAZINE (APRESOLINE) 50 MG  tablet Take 75 mg by mouth 3 (three) times daily.   Yes [provider]  HYDROcodone-acetaminophen (NORCO) 10-325 MG tablet Take 1 tablet by mouth every 6 (six) hours as needed for moderate pain.    Yes [provider]  metoprolol tartrate (LOPRESSOR) 50 MG tablet Take 1 tablet (50 mg total) by mouth 2 (two) times daily. 02/11/17  Yes Matcha, Anupama, MD  NIFEdipine (PROCARDIA-XL/ADALAT CC) 60 MG 24 hr tablet Take 60 mg by mouth daily.   Yes [provider]  pantoprazole (PROTONIX) 40 MG tablet Take 40 mg by mouth daily.   Yes [provider]  potassium chloride SA (K-DUR,KLOR-CON) 20 MEQ tablet Take 1 tablet (20 mEq total) by mouth daily. Take only when taking the Lasix 02/11/17  Yes Matcha, Anupama, MD  spironolactone (ALDACTONE) 25 MG tablet Take 25 mg by mouth daily.   Yes [provider]  Vitamin D, Ergocalciferol, (DRISDOL) 50000 units CAPS capsule Take 50,000 Units by mouth every Monday, Wednesday, and Friday.    Yes [provider]  clindamycin (CLEOCIN) 150 MG capsule Take 150 mg by mouth 3 (three) times daily.    [provider]  levofloxacin (LEVAQUIN) 500 MG tablet Take 1 tablet (500 mg total) by mouth daily. 04/13/17   Dorie Rank, MD    Family History Family History  Problem Relation Age of Onset  . Hypertension Father   . Diabetes Mellitus II Father   . Diabetes Mellitus II Sister     Social History Social History   Tobacco Use  . Smoking status: Never Smoker  . Smokeless tobacco: Never Used  Substance Use Topics  . Alcohol use: No  . Drug use: No     Allergies   Aspirin; Codeine; Peanut (diagnostic); Penicillins; Shellfish allergy; Tetracycline; Cephalexin; Iodinated diagnostic agents; Latex; Morphine; Nitrofuran derivatives; and Sulfa antibiotics   Review of Systems Review of Systems  All other systems reviewed and are negative.    Physical Exam Updated Vital Signs BP (!) 197/85 (BP Location: Left  Arm)   Pulse 60   Temp 98.4 F (36.9 C) (Oral)   Resp 19   SpO2 100%   Physical Exam  Constitutional: She appears well-developed and well-nourished. No distress.  HENT:  Head: Normocephalic and atraumatic.  Right Ear: External ear normal.  Left Ear: External ear normal.  Eyes: Conjunctivae are normal. Right eye exhibits no discharge. Left eye exhibits no discharge. No scleral icterus.  Neck: Neck supple. No tracheal deviation present.  Cardiovascular: Normal rate, regular rhythm and intact distal pulses.  Pulmonary/Chest: Effort normal and breath sounds normal.  No stridor. No respiratory distress. She has no wheezes. She has no rales.  Abdominal: Soft. Bowel sounds are normal. She exhibits no distension. There is no tenderness. There is no rebound and no guarding.  Musculoskeletal: She exhibits no edema or tenderness.       Right knee: She exhibits decreased range of motion and deformity.  Evidence of osteoarthritis and chronic deformity of the right knee, there is a 1.5 circular wound below the knee in the anterior shin, foul-smelling fibrinous and mucopurulent type drainage within the wound, erythema from the knee down to the lower leg,  Neurological: She is alert. She has normal strength. No cranial nerve deficit (no facial droop, extraocular movements intact, no slurred speech) or sensory deficit. She exhibits normal muscle tone. She displays no seizure activity. Coordination normal.  Skin: Skin is warm and dry. No rash noted.  Psychiatric: She has a normal mood and affect.  Nursing note and vitals reviewed.    ED Treatments / Results  Labs (all labs ordered are listed, but only abnormal results are displayed) Labs Reviewed  COMPREHENSIVE METABOLIC PANEL - Abnormal; Notable for the following components:      Result Value   Glucose, Bld 114 (*)    ALT 12 (*)    Total Bilirubin 0.2 (*)    GFR calc non Af Amer 60 (*)    All other components within normal limits  CBC WITH  DIFFERENTIAL/PLATELET - Abnormal; Notable for the following components:   Hemoglobin 11.2 (*)    MCV 75.9 (*)    MCH 23.2 (*)    RDW 16.1 (*)    Platelets 417 (*)    Eosinophils Absolute 0.8 (*)    All other components within normal limits  I-STAT CG4 LACTIC ACID, ED  I-STAT CG4 LACTIC ACID, ED     Procedures Procedures  Patient's lower leg wound was irrigated, all packing was removed and new iodoform gauze packing was placed Medications Ordered in ED Medications  vancomycin (VANCOCIN) IVPB 1000 mg/200 mL premix (0 mg Intravenous Stopped 04/13/17 0016)     Initial Impression / Assessment and Plan / ED Course  I have reviewed the triage vital signs and the nursing notes.  Pertinent labs & imaging results that were available during my care of the patient were reviewed by me and considered in my medical decision making (see chart for details).   Patient presented to the emergency room for evaluation of cellulitis associate with a chronic wound in her leg.  Patient's laboratory tests are reassuring.  No significant leukocytosis.  Lactic acid levels normal.  No signs of sepsis.  Patient was given a dose of IV vancomycin here in the emergency room.  We discussed options and she is comfortable with trying oral antibiotics at home.  Patient has several allergies I will prescribe her Levaquin.  She will need to closely follow-up with her primary care doctor and wound doctor.  If she does not improve she may ultimately need IV antibiotics and admission.  Final Clinical Impressions(s) / ED Diagnoses   Final diagnoses:  Cellulitis of right lower extremity  Wound infection    ED Discharge Orders        Ordered    levofloxacin (LEVAQUIN) 500 MG tablet  Daily     04/13/17 0013       Dorie Rank, MD 04/13/17 [phone]

## 2017-04-12 NOTE — ED Triage Notes (Signed)
Patient was seen at wound center and had no improvement on current antibiotics for her wound on right knee and was instructed to go to ED for IV antibiotics.

## 2017-04-12 NOTE — ED Notes (Signed)
Pt called for room and recheck vs, no response from lobby

## 2017-04-12 NOTE — ED Notes (Signed)
Pt called for recheck v/s, no response from lobby 

## 2017-04-12 NOTE — Progress Notes (Signed)
Pharmacy Note   A consult was received from an ED physician for vancomycin per pharmacy dosing.  The patient's profile has been reviewed for ht/wt/allergies/indication/available labs.   A one time order has been placed for vancomycin 1000 mg IV.  Further antibiotics/pharmacy consults should be ordered by admitting physician if indicated.                       Thank you,  Royetta Asal, PharmD, BCPS Pager [phone] 04/12/2017 10:08 PM

## 2017-04-13 ENCOUNTER — Other Ambulatory Visit: Payer: Self-pay

## 2017-04-13 MED ORDER — LEVOFLOXACIN 500 MG PO TABS: 500.0000 mg | ORAL_TABLET | Freq: Every day | ORAL | 0 refills | Status: DC

## 2017-04-13 NOTE — Discharge Instructions (Signed)
Take antibiotics as prescribed, follow-up with your doctor to make sure your symptoms are improving.  Return for fever worsening symptoms

## 2017-04-18 DIAGNOSIS — L03115 Cellulitis of right lower limb: Secondary | ICD-10-CM | POA: Diagnosis not present

## 2017-04-24 ENCOUNTER — Ambulatory Visit (INDEPENDENT_AMBULATORY_CARE_PROVIDER_SITE_OTHER): Payer: Medicare Other | Admitting: Family

## 2017-04-24 ENCOUNTER — Encounter: Payer: Self-pay | Admitting: Family

## 2017-04-24 VITALS — BP 177/91 | HR 62 | Temp 98.3°F

## 2017-04-24 DIAGNOSIS — A4901 Methicillin susceptible Staphylococcus aureus infection, unspecified site: Secondary | ICD-10-CM

## 2017-04-24 HISTORY — DX: Methicillin susceptible Staphylococcus aureus infection, unspecified site: A49.01

## 2017-04-24 MED ORDER — LEVOFLOXACIN 500 MG PO TABS: 500.0000 mg | ORAL_TABLET | Freq: Every day | ORAL | 0 refills | Status: DC

## 2017-04-24 NOTE — Patient Instructions (Signed)
Nice to meet you.  Please continue the levofloxacin until completed.  We will see you back in 3 weeks.  We will check your blood work today.

## 2017-04-24 NOTE — Assessment & Plan Note (Signed)
MSSA infection of the right knee from previous injection with complicated healing. No evidence of osteomyelitis on CT or x-ray but must continue to monitor given open wound. No systemic signs of infection. I will check her BMP and inflammatory markers today. Will continue levofloxacin for an additional 14 days. Continue wound care per Wound Care provider. Plan to follow up in 3 weeks or sooner if needed.

## 2017-04-24 NOTE — Progress Notes (Signed)
Subjective:    Patient ID: Tamara Howe, female    DOB: 11/17/1945, 72 y.o.   MRN: [id number]  Chief Complaint  Patient presents with  . Wound Infection    HPI:  Tamara Howe is a 72 y.o. female who presents today for evaluation and treatment of a right knee ulcer.  [name] was originally seen on 01/26/17 at her pain provider's office where she received an injection of corticosteroids to help with her osteoarthritis for which she is not a surgical candidate. She was then seen in the ED on 1/1/209 for increasing pain, redness and swelling with the formation of a small bump that was draining.  X-rays of the knee showed no acute abnormalities with severe osteoarthritic changes. The abscess was incised and drained with no complication. She was noted to have leukocytosis of 21.5. Cultures were positive for methicillin sensitive Staphylococcus aureus. She was treated with clindamycin secondary to multiple allergies. CT scan obtained showed a relatively large complex knee joint effusion with diffuse peripheral soft tissue thickening concerning for septic joint effusion; focal soft tissue disruption medial to the proximal right tibia with trace underlying soft tissue air extending superiorly with concern for possible infection. There is no evidence of osseous erosion however tricompartmental osteoarthritis was present as well as osteopenia. She was given clindamycin orally for 4 days on discharge.  She has been followed by wound care since discharge who noted on 3/6 worsening of wound with increased errythemia with intermittent chills and nausea. She was on antibiotics at the time of a secondary culture that resulted with MSSA amongst mixed bacterial organisms. She was seen in the ED on 3/7 and was given a dose of vancomycin and discharged with levofloxacin.   Since her ED visit she reports taking the levofloxacin as prescribed and denies adverse side effects or myalgias. No fevers. She continues to  have pain located in her right knee which is currently bandaged per wound care. She has a follow up appointment tomorrow. Husband states that the redness in her leg has slowly improved since starting the levofloxacin.    Allergies  Allergen Reactions  . Aspirin Shortness Of Breath    respiratory distress  . Codeine Nausea Only  . Peanut (Diagnostic) Anaphylaxis    hives and wheezing, anaphylaxis  . Penicillins Anaphylaxis    Has patient had a PCN reaction causing immediate rash, facial/tongue/throat swelling, SOB or lightheadedness with hypotension: Yes Has patient had a PCN reaction causing severe rash involving mucus membranes or skin necrosis: No Has patient had a PCN reaction that required hospitalization Yes Has patient had a PCN reaction occurring within the last 10 years: No If all of the above answers are "NO", then may proceed with Cephalosporin use.   . Shellfish Allergy Anaphylaxis    Respiratory arrest  . Tetracycline Anaphylaxis    Respiratory arrest  . Cephalexin Hives  . Iodinated Diagnostic Agents Hives  . Latex Hives  . Morphine Other (See Comments)    Possible resp arrest?? Patient is unsure  . Nitrofuran Derivatives Other (See Comments)    Unknown by patient  . Sulfa Antibiotics Hives      Outpatient Medications Prior to Visit  Medication Sig Dispense Refill  . acetaminophen (TYLENOL) 500 MG tablet Take 1,000 mg by mouth every 6 (six) hours as needed for moderate pain.    Marland Kitchen albuterol (PROVENTIL HFA) 108 (90 Base) MCG/ACT inhaler Inhale 2 puffs into the lungs every 6 (six) hours as needed for wheezing or shortness  of breath.    Marland Kitchen albuterol (PROVENTIL) (2.5 MG/3ML) 0.083% nebulizer solution Take 2.5 mg by nebulization every 6 (six) hours as needed for wheezing or shortness of breath.    Marland Kitchen atorvastatin (LIPITOR) 40 MG tablet Take 40 mg by mouth daily.    . chlorthalidone (HYGROTON) 25 MG tablet Take 25 mg by mouth daily.    . clindamycin (CLEOCIN) 150 MG  capsule Take 150 mg by mouth 3 (three) times daily.    . clonazePAM (KLONOPIN) 0.5 MG tablet Take 0.5 mg by mouth 2 (two) times daily as needed for anxiety.    . cloNIDine (CATAPRES - DOSED IN MG/24 HR) 0.2 mg/24hr patch Place 0.2 mg onto the skin once a week.    . divalproex (DEPAKOTE ER) 500 MG 24 hr tablet Take 500 mg by mouth daily.    Marland Kitchen EPINEPHrine (EPIPEN 2-PAK) 0.3 mg/0.3 mL IJ SOAJ injection Inject 0.3 mg into the muscle once. For anaphylaxis    . hydrALAZINE (APRESOLINE) 50 MG tablet Take 75 mg by mouth 3 (three) times daily.    . metoprolol tartrate (LOPRESSOR) 50 MG tablet Take 1 tablet (50 mg total) by mouth 2 (two) times daily. 60 tablet 0  . NIFEdipine (PROCARDIA-XL/ADALAT CC) 60 MG 24 hr tablet Take 60 mg by mouth daily.    . potassium chloride SA (K-DUR,KLOR-CON) 20 MEQ tablet Take 1 tablet (20 mEq total) by mouth daily. Take only when taking the Lasix 5 tablet 0  . spironolactone (ALDACTONE) 25 MG tablet Take 25 mg by mouth daily.    . Vitamin D, Ergocalciferol, (DRISDOL) 50000 units CAPS capsule Take 50,000 Units by mouth every Monday, Wednesday, and Friday.     . levofloxacin (LEVAQUIN) 500 MG tablet Take 1 tablet (500 mg total) by mouth daily. 10 tablet 0  . ferrous sulfate 325 (65 FE) MG tablet Take 325 mg by mouth daily with breakfast.    . HYDROcodone-acetaminophen (NORCO) 10-325 MG tablet Take 1 tablet by mouth every 6 (six) hours as needed for moderate pain.     . pantoprazole (PROTONIX) 40 MG tablet Take 40 mg by mouth daily.     No facility-administered medications prior to visit.      Past Medical History:  Diagnosis Date  . Abdominal mass, LLQ (left lower quadrant)   . Anemia   . Anxiety   . Asthma   . Bladder infection, chronic   . Cellulitis and abscess of leg 02/08/2017  . Chest pain   . Chronic pain   . Complication of anesthesia    "stopped breathing with tumor removal surgery, not happened since then"  . Constipation   . Cough   . Depression   .  Dietary counseling   . Dysuria   . Edema extremities   . Enthesopathy of knee   . Esophagitis   . GERD (gastroesophageal reflux disease)   . Headache   . Heart murmur   . High risk medication use   . History of bronchitis   . Hypercholesterolemia   . Hypertension   . Insomnia   . 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 Lipoma of skin   . Low back pain   . Malaise and fatigue   . Nocturia   . Noncompliance with medications   . Osteoarthritis   . PONV (postoperative nausea and vomiting)   . Prediabetes   . Rash   . Shortness of breath dyspnea   . Vitamin D deficiency  Past Surgical History:  Procedure Laterality Date  . ABDOMINAL HYSTERECTOMY    . ANKLE SURGERY Right   . APPENDECTOMY    . CARDIAC CATHETERIZATION     12/13/12 [facility], Dr. Gibson Ramp): 30-40% mid LAD.  Marland Kitchen SALIVARY GLAND SURGERY    . TUMOR EXCISION        Family History  Problem Relation Age of Onset  . Hypertension Father   . Diabetes Mellitus II Father   . Diabetes Mellitus II Sister   . Asthma Mother       Social History   Socioeconomic History  . Marital status: Married    Spouse name: Not on file  . Number of children: Not on file  . Years of education: Not on file  . Highest education level: Not on file  Social Needs  . Financial resource strain: Not on file  . Food insecurity - worry: Not on file  . Food insecurity - inability: Not on file  . Transportation needs - medical: Not on file  . Transportation needs - non-medical: Not on file  Occupational History  . Not on file  Tobacco Use  . Smoking status: Never Smoker  . Smokeless tobacco: Never Used  Substance and Sexual Activity  . Alcohol use: No  . Drug use: No  . Sexual activity: Not on file  Other Topics Concern  . Not on file  Social History Narrative  . Not on file      Review of Systems  Constitutional: Negative for chills and fever.  Respiratory: Negative for cough, chest tightness,  shortness of breath and wheezing.   Cardiovascular: Negative for chest pain, palpitations and leg swelling.  Musculoskeletal:       Positive for right knee pain  Skin: Positive for wound.  Neurological: Negative for weakness.       Objective:    BP (!) 177/91 (BP Location: Left Arm, Patient Position: Sitting, Cuff Size: Normal)   Pulse 62   Temp 98.3 F (36.8 C)   SpO2 97%  Nursing note and vital signs reviewed.  Physical Exam  Constitutional: She is oriented to person, place, and time. She appears well-developed and well-nourished. No distress.  Seated in the wheelchair in no distress; pleasant. Tearful at times.   Cardiovascular: Normal rate, regular rhythm, normal heart sounds and intact distal pulses.  Pulmonary/Chest: Effort normal and breath sounds normal.  Neurological: She is alert and oriented to person, place, and time.  Skin: Skin is warm and dry.  Wound is dressed and appears clean, dry and intact. There is some errythema on the distal aspect of the femur. Distal pulses and sensation are intact and appropriate.   Psychiatric: She has a normal mood and affect. Her behavior is normal. Judgment and thought content normal.        Assessment & Plan:   Problem List Items Addressed This Visit      Other   MSSA (methicillin susceptible Staphylococcus aureus) infection - Primary    MSSA infection of the right knee from previous injection with complicated healing. No evidence of osteomyelitis on CT or x-ray but must continue to monitor given open wound. No systemic signs of infection. I will check her BMP and inflammatory markers today. Will continue levofloxacin for an additional 14 days. Continue wound care per Wound Care provider. Plan to follow up in 3 weeks or sooner if needed.       Relevant Orders   Basic Metabolic Panel (BMET)   C-reactive protein  I am having [name] maintain her HYDROcodone-acetaminophen, divalproex, ferrous sulfate, NIFEdipine,  chlorthalidone, clonazePAM, albuterol, Vitamin D (Ergocalciferol), acetaminophen, EPINEPHrine, albuterol, cloNIDine, potassium chloride SA, metoprolol tartrate, clindamycin, atorvastatin, hydrALAZINE, pantoprazole, spironolactone, and levofloxacin.   Meds ordered this encounter  Medications  . DISCONTD: levofloxacin (LEVAQUIN) 500 MG tablet    Sig: Take 1 tablet (500 mg total) by mouth daily.    Dispense:  20 tablet    Refill:  0    Order Specific Question:   Supervising Provider    Answer:   Carlyle Basques [[id number]]  . levofloxacin (LEVAQUIN) 500 MG tablet    Sig: Take 1 tablet (500 mg total) by mouth daily.    Dispense:  20 tablet    Refill:  0    Order Specific Question:   Supervising Provider    Answer:   Carlyle Basques [[id number]]     Follow-up: Return in about 3 weeks (around 05/15/2017), or if symptoms worsen or fail to improve.  Mauricio Po, [facility]

## 2017-04-25 LAB — BASIC METABOLIC PANEL
BUN: 13 mg/dL (ref 7–25)
CO2: 24 mmol/L (ref 20–32)
CREATININE: 0.88 mg/dL (ref 0.60–0.93)
Calcium: 9.4 mg/dL (ref 8.6–10.4)
Chloride: 105 mmol/L (ref 98–110)
Glucose, Bld: 113 mg/dL — ABNORMAL HIGH (ref 65–99)
POTASSIUM: 4 mmol/L (ref 3.5–5.3)
SODIUM: 138 mmol/L (ref 135–146)

## 2017-04-25 LAB — C-REACTIVE PROTEIN: CRP: 4.6 mg/L (ref <8.0)

## 2017-04-26 ENCOUNTER — Telehealth: Payer: Self-pay | Admitting: *Deleted

## 2017-04-26 NOTE — Telephone Encounter (Signed)
-----   Message from Golden Circle, FNP sent at 04/26/2017 10:19 AM EDT ----- Please inform patient that her lab work is looking good. Please continue to take the levofloxacin and follow up as discussed.

## 2017-04-26 NOTE — Telephone Encounter (Signed)
Relayed to patient. She did not have questions at this time.  Landis Gandy, RN

## 2017-05-01 DIAGNOSIS — L03115 Cellulitis of right lower limb: Secondary | ICD-10-CM | POA: Diagnosis not present

## 2017-05-02 ENCOUNTER — Encounter (HOSPITAL_COMMUNITY): Payer: Self-pay | Admitting: *Deleted

## 2017-05-02 ENCOUNTER — Emergency Department (HOSPITAL_COMMUNITY): Payer: Medicare Other

## 2017-05-02 ENCOUNTER — Inpatient Hospital Stay (HOSPITAL_COMMUNITY)
Admission: EM | Admit: 2017-05-02 | Discharge: 2017-05-07 | DRG: 603 | Disposition: A | Discharge status: Home Health | Payer: Medicare Other | Attending: Internal Medicine | Admitting: Internal Medicine

## 2017-05-02 DIAGNOSIS — Z888 Allergy status to other drugs, medicaments and biological substances status: Secondary | ICD-10-CM

## 2017-05-02 DIAGNOSIS — Z885 Allergy status to narcotic agent status: Secondary | ICD-10-CM

## 2017-05-02 DIAGNOSIS — M1990 Unspecified osteoarthritis, unspecified site: Secondary | ICD-10-CM | POA: Diagnosis present

## 2017-05-02 DIAGNOSIS — F419 Anxiety disorder, unspecified: Secondary | ICD-10-CM | POA: Diagnosis not present

## 2017-05-02 DIAGNOSIS — Z833 Family history of diabetes mellitus: Secondary | ICD-10-CM

## 2017-05-02 DIAGNOSIS — Z91013 Allergy to seafood: Secondary | ICD-10-CM

## 2017-05-02 DIAGNOSIS — Z8673 Personal history of transient ischemic attack (TIA), and cerebral infarction without residual deficits: Secondary | ICD-10-CM

## 2017-05-02 DIAGNOSIS — Z90710 Acquired absence of both cervix and uterus: Secondary | ICD-10-CM

## 2017-05-02 DIAGNOSIS — K219 Gastro-esophageal reflux disease without esophagitis: Secondary | ICD-10-CM | POA: Diagnosis present

## 2017-05-02 DIAGNOSIS — Z91040 Latex allergy status: Secondary | ICD-10-CM

## 2017-05-02 DIAGNOSIS — Z880 Allergy status to penicillin: Secondary | ICD-10-CM

## 2017-05-02 DIAGNOSIS — L97819 Non-pressure chronic ulcer of other part of right lower leg with unspecified severity: Secondary | ICD-10-CM | POA: Diagnosis present

## 2017-05-02 DIAGNOSIS — I10 Essential (primary) hypertension: Secondary | ICD-10-CM | POA: Diagnosis not present

## 2017-05-02 DIAGNOSIS — Z91041 Radiographic dye allergy status: Secondary | ICD-10-CM

## 2017-05-02 DIAGNOSIS — J45909 Unspecified asthma, uncomplicated: Secondary | ICD-10-CM | POA: Diagnosis present

## 2017-05-02 DIAGNOSIS — D509 Iron deficiency anemia, unspecified: Secondary | ICD-10-CM | POA: Diagnosis present

## 2017-05-02 DIAGNOSIS — Z91010 Allergy to peanuts: Secondary | ICD-10-CM

## 2017-05-02 DIAGNOSIS — Z882 Allergy status to sulfonamides status: Secondary | ICD-10-CM

## 2017-05-02 DIAGNOSIS — Z825 Family history of asthma and other chronic lower respiratory diseases: Secondary | ICD-10-CM

## 2017-05-02 DIAGNOSIS — E785 Hyperlipidemia, unspecified: Secondary | ICD-10-CM | POA: Diagnosis present

## 2017-05-02 DIAGNOSIS — E7800 Pure hypercholesterolemia, unspecified: Secondary | ICD-10-CM | POA: Diagnosis present

## 2017-05-02 DIAGNOSIS — Z79899 Other long term (current) drug therapy: Secondary | ICD-10-CM

## 2017-05-02 DIAGNOSIS — I160 Hypertensive urgency: Secondary | ICD-10-CM | POA: Diagnosis present

## 2017-05-02 DIAGNOSIS — Z79891 Long term (current) use of opiate analgesic: Secondary | ICD-10-CM

## 2017-05-02 DIAGNOSIS — L03115 Cellulitis of right lower limb: Secondary | ICD-10-CM | POA: Diagnosis not present

## 2017-05-02 DIAGNOSIS — L02415 Cutaneous abscess of right lower limb: Secondary | ICD-10-CM | POA: Diagnosis present

## 2017-05-02 DIAGNOSIS — R7303 Prediabetes: Secondary | ICD-10-CM | POA: Diagnosis present

## 2017-05-02 DIAGNOSIS — G8929 Other chronic pain: Secondary | ICD-10-CM | POA: Diagnosis present

## 2017-05-02 DIAGNOSIS — M25461 Effusion, right knee: Secondary | ICD-10-CM | POA: Diagnosis present

## 2017-05-02 DIAGNOSIS — G4700 Insomnia, unspecified: Secondary | ICD-10-CM | POA: Diagnosis present

## 2017-05-02 DIAGNOSIS — E559 Vitamin D deficiency, unspecified: Secondary | ICD-10-CM | POA: Diagnosis present

## 2017-05-02 DIAGNOSIS — Z8249 Family history of ischemic heart disease and other diseases of the circulatory system: Secondary | ICD-10-CM

## 2017-05-02 LAB — CBC WITH DIFFERENTIAL/PLATELET
Basophils Absolute: 0 10*3/uL (ref 0.0–0.1)
Basophils Relative: 0 %
EOS PCT: 11 %
Eosinophils Absolute: 1.2 10*3/uL — ABNORMAL HIGH (ref 0.0–0.7)
HEMATOCRIT: 32.1 % — AB (ref 36.0–46.0)
Hemoglobin: 9.8 g/dL — ABNORMAL LOW (ref 12.0–15.0)
LYMPHS ABS: 2.6 10*3/uL (ref 0.7–4.0)
LYMPHS PCT: 25 %
MCH: 23.3 pg — AB (ref 26.0–34.0)
MCHC: 30.5 g/dL (ref 30.0–36.0)
MCV: 76.2 fL — AB (ref 78.0–100.0)
MONO ABS: 0.7 10*3/uL (ref 0.1–1.0)
Monocytes Relative: 7 %
NEUTROS ABS: 5.8 10*3/uL (ref 1.7–7.7)
Neutrophils Relative %: 57 %
PLATELETS: 420 10*3/uL — AB (ref 150–400)
RBC: 4.21 MIL/uL (ref 3.87–5.11)
RDW: 17 % — ABNORMAL HIGH (ref 11.5–15.5)
WBC: 10.3 10*3/uL (ref 4.0–10.5)

## 2017-05-02 LAB — COMPREHENSIVE METABOLIC PANEL
ALT: 13 U/L — ABNORMAL LOW (ref 14–54)
ANION GAP: 8 (ref 5–15)
AST: 21 U/L (ref 15–41)
Albumin: 3.6 g/dL (ref 3.5–5.0)
Alkaline Phosphatase: 64 U/L (ref 38–126)
BUN: 22 mg/dL — AB (ref 6–20)
CHLORIDE: 108 mmol/L (ref 101–111)
CO2: 25 mmol/L (ref 22–32)
Calcium: 9.3 mg/dL (ref 8.9–10.3)
Creatinine, Ser: 0.78 mg/dL (ref 0.44–1.00)
GFR calc Af Amer: >60 mL/min (ref >60)
GFR calc non Af Amer: >60 mL/min (ref >60)
GLUCOSE: 102 mg/dL — AB (ref 65–99)
POTASSIUM: 4 mmol/L (ref 3.5–5.1)
SODIUM: 141 mmol/L (ref 135–145)
Total Bilirubin: 0.2 mg/dL — ABNORMAL LOW (ref 0.3–1.2)
Total Protein: 6.8 g/dL (ref 6.5–8.1)

## 2017-05-02 MED ORDER — VANCOMYCIN HCL IN DEXTROSE 1-5 GM/200ML-% IV SOLN
1000.0000 mg | Freq: Once | INTRAVENOUS | Status: AC
  Administered 2017-05-03: 1000 mg via INTRAVENOUS
  Filled 2017-05-02: qty 200

## 2017-05-02 MED ORDER — HYDROCODONE-ACETAMINOPHEN 5-325 MG PO TABS
2.0000 | ORAL_TABLET | Freq: Once | ORAL | Status: AC
  Administered 2017-05-03: 2 via ORAL
  Filled 2017-05-02: qty 2

## 2017-05-02 NOTE — ED Provider Notes (Signed)
[facility] DEPT Provider Note   CSN: [id number] Arrival date & time: 05/02/17  1737     History   Chief Complaint Chief Complaint  Patient presents with  . Wound Infection    HPI Tamara Howe is a 72 y.o. female.  HPI   Tamara Howe is a 72 y.o. female, with a history of anemia, asthma, CVA, HTN, presenting to the ED with suspicion for right lower leg infection.  Patient notes spreading redness to the right lower extremity as well as increasing pain and full body chills over the past 2 days.  Patient has a persistent wound in the right lower leg just below the knee with poor wound healing since December that patient states was suspected to be from an intra-articular injection for pain management.  Patient has been receiving wound care from [facility] wound [facility] center.  She states she was told she is making good progress at her wound care appointment a couple weeks ago.  When she went for the appointment yesterday, there was significant concern from the wound care physician, Dr. Quentin Cornwall, of an acute infection as well as concern for osteomyelitis.  Patient has been on Levaquin 500 mg daily for the past 20 days. She arrives with a letter from Dr. Quentin Cornwall requesting admission for IV antibiotics and evaluation with MRI versus CT. Patient's pain is throbbing, 5/10, radiating distally down the leg. Denies fever, N/V/D, urinary symptoms, acute neuro deficits, trauma, shortness of breath, chest pain, or any other complaints.    Past Medical History:  Diagnosis Date  . Abdominal mass, LLQ (left lower quadrant)   . Anemia   . Anxiety   . Asthma   . Bladder infection, chronic   . Cellulitis and abscess of leg 02/08/2017  . Chest pain   . Chronic pain   . Complication of anesthesia    "stopped breathing with tumor removal surgery, not happened since then"  . Constipation   . Cough   . Depression   . Dietary counseling   . Dysuria   . Edema  extremities   . Enthesopathy of knee   . Esophagitis   . GERD (gastroesophageal reflux disease)   . Headache   . Heart murmur   . High risk medication use   . History of bronchitis   . Hypercholesterolemia   . Hypertension   . Insomnia   . Ischemic cerebrovascular accident (CVA) of frontal lobe ([place])   . Lipoma of skin   . Low back pain   . Malaise and fatigue   . Nocturia   . Noncompliance with medications   . Osteoarthritis   . PONV (postoperative nausea and vomiting)   . Prediabetes   . Rash   . Shortness of breath dyspnea   . Vitamin D deficiency     Patient Active Problem List   Diagnosis Date Noted  . Cellulitis of right lower extremity 05/03/2017  . MSSA (methicillin susceptible Staphylococcus aureus) infection 04/24/2017  . Cellulitis and abscess of right lower extremity   . Sepsis ([place]) 02/07/2017  . Cellulitis of left lower extremity 02/07/2017  . Normocytic normochromic anemia 02/07/2017  . Essential hypertension 02/07/2017    Past Surgical History:  Procedure Laterality Date  . ABDOMINAL HYSTERECTOMY    . ANKLE SURGERY Right   . APPENDECTOMY    . CARDIAC CATHETERIZATION     12/13/12 [facility], Dr. Gibson Ramp): 30-40% mid LAD.  Marland Kitchen SALIVARY GLAND SURGERY    . TUMOR EXCISION  OB History   None      Home Medications    Prior to Admission medications   Medication Sig Start Date End Date Taking? Authorizing Provider  acetaminophen (TYLENOL) 500 MG tablet Take 1,000 mg by mouth every 6 (six) hours as needed for moderate pain.   Yes [provider]  albuterol (PROVENTIL HFA) 108 (90 Base) MCG/ACT inhaler Inhale 2 puffs into the lungs every 6 (six) hours as needed for wheezing or shortness of breath.   Yes [provider]  albuterol (PROVENTIL) (2.5 MG/3ML) 0.083% nebulizer solution Take 2.5 mg by nebulization every 6 (six) hours as needed for wheezing or shortness of breath.   Yes [provider]  atorvastatin (LIPITOR) 40 MG  tablet Take 40 mg by mouth daily.   Yes [provider]  chlorthalidone (HYGROTON) 25 MG tablet Take 25 mg by mouth daily.   Yes [provider]  clindamycin (CLEOCIN) 150 MG capsule Take 150 mg by mouth 3 (three) times daily.   Yes [provider]  clonazePAM (KLONOPIN) 0.5 MG tablet Take 0.5 mg by mouth 2 (two) times daily as needed for anxiety.   Yes [provider]  cloNIDine (CATAPRES - DOSED IN MG/24 HR) 0.2 mg/24hr patch Place 0.2 mg onto the skin once a week.   Yes [provider]  divalproex (DEPAKOTE ER) 500 MG 24 hr tablet Take 500 mg by mouth daily.   Yes [provider]  EPINEPHrine (EPIPEN 2-PAK) 0.3 mg/0.3 mL IJ SOAJ injection Inject 0.3 mg into the muscle once. For anaphylaxis   Yes [provider]  hydrALAZINE (APRESOLINE) 50 MG tablet Take 75 mg by mouth 3 (three) times daily.   Yes [provider]  HYDROcodone-acetaminophen (NORCO) 10-325 MG tablet Take 1 tablet by mouth every 6 (six) hours as needed for moderate pain.    Yes [provider]  levofloxacin (LEVAQUIN) 500 MG tablet Take 1 tablet (500 mg total) by mouth daily. 04/24/17  Yes Golden Circle, FNP  metoprolol tartrate (LOPRESSOR) 50 MG tablet Take 1 tablet (50 mg total) by mouth 2 (two) times daily. 02/11/17  Yes Matcha, Anupama, MD  NIFEdipine (PROCARDIA-XL/ADALAT CC) 60 MG 24 hr tablet Take 60 mg by mouth daily.   Yes [provider]  potassium chloride SA (K-DUR,KLOR-CON) 20 MEQ tablet Take 1 tablet (20 mEq total) by mouth daily. Take only when taking the Lasix 02/11/17  Yes Matcha, Anupama, MD  Vitamin D, Ergocalciferol, (DRISDOL) 50000 units CAPS capsule Take 50,000 Units by mouth every Monday, Wednesday, and Friday.    Yes [provider]  spironolactone (ALDACTONE) 25 MG tablet Take 25 mg by mouth daily.    [provider]    Family History Family History  Problem Relation Age of Onset  . Hypertension  Father   . Diabetes Mellitus II Father   . Diabetes Mellitus II Sister   . Asthma Mother     Social History Social History   Tobacco Use  . Smoking status: Never Smoker  . Smokeless tobacco: Never Used  Substance Use Topics  . Alcohol use: No  . Drug use: No     Allergies   Aspirin; Codeine; Peanut (diagnostic); Penicillins; Shellfish allergy; Tetracycline; Cephalexin; Iodinated diagnostic agents; Latex; Morphine; Nitrofuran derivatives; and Sulfa antibiotics   Review of Systems Review of Systems  Constitutional: Positive for chills.  Respiratory: Negative for shortness of breath.   Cardiovascular: Negative for chest pain.  Gastrointestinal: Negative for abdominal pain, diarrhea, nausea  and vomiting.  Musculoskeletal: Positive for arthralgias.  Skin: Positive for color change and wound.  All other systems reviewed and are negative.    Physical Exam Updated Vital Signs BP (!) 187/84 (BP Location: Left Arm)   Pulse 85   Temp 98.6 F (37 C) (Oral)   Resp 18   SpO2 99%   Physical Exam  Constitutional: She appears well-developed and well-nourished. No distress.  HENT:  Head: Normocephalic and atraumatic.  Eyes: Conjunctivae are normal.  Neck: Neck supple.  Cardiovascular: Normal rate, regular rhythm, normal heart sounds and intact distal pulses.  Pulmonary/Chest: Effort normal and breath sounds normal. No respiratory distress.  Abdominal: Soft. There is no tenderness. There is no guarding.  Musculoskeletal: She exhibits edema and tenderness.  Lymphadenopathy:    She has no cervical adenopathy.  Neurological: She is alert.  Skin: Skin is warm and dry. She is not diaphoretic. There is erythema.  Swelling, erythema, tenderness, and increased warmth to the right lower extremity.  Appears to extend from the knee into the foot. Patient has deformity in the right knee baseline.  Psychiatric: She has a normal mood and affect. Her behavior is normal.  Nursing note and  vitals reviewed.     Patient's leg with wound packing still in place.                ED Treatments / Results  Labs (all labs ordered are listed, but only abnormal results are displayed) Labs Reviewed  COMPREHENSIVE METABOLIC PANEL - Abnormal; Notable for the following components:      Result Value   Glucose, Bld 102 (*)    BUN 22 (*)    ALT 13 (*)    Total Bilirubin 0.2 (*)    All other components within normal limits  CBC WITH DIFFERENTIAL/PLATELET - Abnormal; Notable for the following components:   Hemoglobin 9.8 (*)    HCT 32.1 (*)    MCV 76.2 (*)    MCH 23.3 (*)    RDW 17.0 (*)    Platelets 420 (*)    Eosinophils Absolute 1.2 (*)    All other components within normal limits  CULTURE, BLOOD (ROUTINE X 2)  CULTURE, BLOOD (ROUTINE X 2)  AEROBIC CULTURE (SUPERFICIAL SPECIMEN)  MRSA PCR SCREENING  CBC  BASIC METABOLIC PANEL  C-REACTIVE PROTEIN  SEDIMENTATION RATE  I-STAT CG4 LACTIC ACID, ED  I-STAT CG4 LACTIC ACID, ED    EKG None  Radiology Dg Tibia/fibula Right  Result Date: 05/02/2017 CLINICAL DATA:  Knee and lower leg swelling and erythema. Likely infection. EXAM: RIGHT TIBIA AND FIBULA - 2 VIEW COMPARISON:  None. FINDINGS: Soft tissue swelling is evident about the knee. Healed proximal fibular fracture is noted. Mild osteopenia is present. No other focal soft tissue abnormalities are present in the lower leg. The ankle joint is located. IMPRESSION: Proximal soft tissue swelling about the knee. Please see knee radiograph report of the same day. The more distal tibia and fibula demonstrate no acute bone or soft tissue abnormalities. Electronically Signed   By: San Morelle M.D.   On: 05/02/2017 20:33   Dg Knee Complete 4 Views Right  Result Date: 05/02/2017 CLINICAL DATA:  Knee and lower leg swelling and erythema. EXAM: RIGHT KNEE - COMPLETE 4+ VIEW COMPARISON:  Right knee radiographs 03/28/2017. FINDINGS: Lateral tibial plateau collapse and  sclerosis of the lateral compartment of the knee is again seen. Changes of the lateral femoral condyle demonstrate some progression. There is a dressing or drain at  the site of the previously noted ulcer. A moderate-sized joint effusion remains. No osseous destruction is evident. Proximal fibular remote fracture is noted. IMPRESSION: 1. Progressive sclerotic changes of the lateral femoral condyle. 2. Stable changes of the lateral tibial plateau. 3. No focal osseous destruction. 4. Persistent moderate effusion. 5. Drain or dressing in the anterior medial soft tissues of the lower leg. Electronically Signed   By: San Morelle M.D.   On: 05/02/2017 20:32    Procedures Procedures (including critical care time)  Medications Ordered in ED Medications  vancomycin (VANCOCIN) IVPB 1000 mg/200 mL premix (has no administration in time range)  HYDROcodone-acetaminophen (NORCO/VICODIN) 5-325 MG per tablet 2 tablet (has no administration in time range)  HYDROcodone-acetaminophen (NORCO) 10-325 MG per tablet 1 tablet (has no administration in time range)  metoprolol tartrate (LOPRESSOR) tablet 50 mg (has no administration in time range)  hydrALAZINE (APRESOLINE) tablet 75 mg (has no administration in time range)  divalproex (DEPAKOTE ER) 24 hr tablet 500 mg (has no administration in time range)  cloNIDine (CATAPRES - Dosed in mg/24 hr) patch 0.2 mg (has no administration in time range)  NIFEdipine (PROCARDIA-XL/ADALAT CC) 24 hr tablet 60 mg (has no administration in time range)  potassium chloride SA (K-DUR,KLOR-CON) CR tablet 20 mEq (has no administration in time range)  chlorthalidone (HYGROTON) tablet 25 mg (has no administration in time range)  albuterol (PROVENTIL) (2.5 MG/3ML) 0.083% nebulizer solution 2.5 mg (has no administration in time range)  atorvastatin (LIPITOR) tablet 40 mg (has no administration in time range)  clonazePAM (KLONOPIN) tablet 0.5 mg (has no administration in time range)    enoxaparin (LOVENOX) injection 40 mg (has no administration in time range)  ondansetron (ZOFRAN) tablet 4 mg (has no administration in time range)    Or  ondansetron (ZOFRAN) injection 4 mg (has no administration in time range)  acetaminophen (TYLENOL) tablet 650 mg (has no administration in time range)    Or  acetaminophen (TYLENOL) suppository 650 mg (has no administration in time range)     Initial Impression / Assessment and Plan / ED Course  I have reviewed the triage vital signs and the nursing notes.  Pertinent labs & imaging results that were available during my care of the patient were reviewed by me and considered in my medical decision making (see chart for details).  Clinical Course as of May 04 11  Wed May 02, 2017  2315 This value is inaccurate. Patient is sitting in bed, alert, and in no distress. SPO2 98%.  SpO2(!): 28 % [SJ]  Thu May 03, 2017  0009 Spoke with Dr. Tamala Julian, hospitalist. Agreed to admit the patient.   [SJ]    Clinical Course User Index [SJ] Kaytlan Behrman C, PA-C    Patient presents with spreading erythema and increasing pain to the right lower extremity.  She has been on PO levaquin, but has essentially failed outpatient therapy.  Patient is nontoxic appearing, afebrile, not tachycardic, not tachypneic, not hypotensive, and maintains excellent SPO2 on room air. Moderate knee effusion noted on x-ray.  Appearance of cellulitis overlying the joint, therefore, arthrocentesis in the ED is contraindicated.  Admission for IV antibiotics and further evaluation.  Findings and plan of care discussed with Malvin Johns, MD. Dr. Tamera Punt personally evaluated and examined this patient.  Final Clinical Impressions(s) / ED Diagnoses   Final diagnoses:  Cellulitis of right lower extremity    ED Discharge Orders    None       Willies Laviolette C,  PA-C 05/03/17 0014    Malvin Johns, MD 05/08/17 [id number]

## 2017-05-02 NOTE — H&P (Addendum)
History and Physical    Tamara Howe ERD:[id number] DOB: 1945-09-28 DOA: 05/02/2017  Referring MD/NP/PA: Arlean Hopping, PA-C PCP: [facility]  Patient coming from: Sent from wound center  Chief Complaint: Right lower extremity wound  I have personally briefly reviewed patient's old medical records in [place]   HPI: Tamara Howe is a 72 y.o. female with medical history significant of HTN, chronic right lower extremity wound, cellulitis, chronic pain, anemia; who presents with worsening wound and erythema of the right lower extremity. Patient has had his right lower extremity wound since December of 2018 that she noted after having a joint injection.  Last admitted to the hospital from 1/1-1/6 for sepsis thought to be secondary to right lower extremity cellulitis with wound cultures positive for MSSA.  Patient was initially treated with vancomycin.  ID was consulted and patient was changed to clindamycin at discharge. She had been going to the wound care center and was being treated with Levaquin for the last 20 days of 30-day course.  The patient's husband had been changing out the dressing as previously advised.  Over the last 24-48 hours patient reports acute redness of the right lower extremity with worsening drainage from the wound.  She had followed up with the wound care center today and was sent over for need of IV antibiotics and further imaging as there was concern for osteomyelitis.  Denies having any significant fever, vomiting, or chest pain.   ED Course: Upon admission into the emergency department patient was noted to be afebrile, blood pressure 110/67-201/114, pulse 78-91, respiration 14-18, and oxygen saturations maintained on room air.  Labs revealed WBC 10.3, hemoglobin 9.8, platelets 420, BUN 22,  and creatinine 0.70.  X-ray imaging showed progressive sclerotic changes of the lateral femoral condyle and moderate joint effusion of the right leg.  Blood cultures were  obtained and patient was started on IV antibiotics of vancomycin.  CT scan of the right leg was ordered and pending without contrast.  Review of Systems  Constitutional: Positive for chills. Negative for fever.  HENT: Negative for ear discharge and nosebleeds.   Eyes: Negative for photophobia and discharge.  Respiratory: Negative for cough and sputum production.   Cardiovascular: Positive for leg swelling. Negative for chest pain.  Gastrointestinal: Positive for nausea. Negative for constipation and vomiting.  Genitourinary: Negative for dysuria and frequency.  Musculoskeletal: Positive for joint pain and myalgias. Negative for back pain and neck pain.  Skin: Negative for itching and rash.       Positive for color skin change  Neurological: Negative for speech change and weakness.  Endo/Heme/Allergies: Negative for environmental allergies and polydipsia.  Psychiatric/Behavioral: Negative for hallucinations and substance abuse.    Past Medical History:  Diagnosis Date  . Abdominal mass, LLQ (left lower quadrant)   . Anemia   . Anxiety   . Asthma   . Bladder infection, chronic   . Cellulitis and abscess of leg 02/08/2017  . Chest pain   . Chronic pain   . Complication of anesthesia    "stopped breathing with tumor removal surgery, not happened since then"  . Constipation   . Cough   . Depression   . Dietary counseling   . Dysuria   . Edema extremities   . Enthesopathy of knee   . Esophagitis   . GERD (gastroesophageal reflux disease)   . Headache   . Heart murmur   . High risk medication use   . History of bronchitis   .  Hypercholesterolemia   . Hypertension   . Insomnia   . Ischemic cerebrovascular accident (CVA) of frontal lobe ([place])   . Lipoma of skin   . Low back pain   . Malaise and fatigue   . Nocturia   . Noncompliance with medications   . Osteoarthritis   . PONV (postoperative nausea and vomiting)   . Prediabetes   . Rash   . Shortness of breath dyspnea     . Vitamin D deficiency     Past Surgical History:  Procedure Laterality Date  . ABDOMINAL HYSTERECTOMY    . ANKLE SURGERY Right   . APPENDECTOMY    . CARDIAC CATHETERIZATION     12/13/12 [facility], Dr. Gibson Ramp): 30-40% mid LAD.  Marland Kitchen SALIVARY GLAND SURGERY    . TUMOR EXCISION       reports that she has never smoked. She has never used smokeless tobacco. She reports that she does not drink alcohol or use drugs.  Allergies  Allergen Reactions  . Aspirin Shortness Of Breath    respiratory distress  . Codeine Nausea Only  . Peanut (Diagnostic) Anaphylaxis    hives and wheezing, anaphylaxis  . Penicillins Anaphylaxis    Has patient had a PCN reaction causing immediate rash, facial/tongue/throat swelling, SOB or lightheadedness with hypotension: Yes Has patient had a PCN reaction causing severe rash involving mucus membranes or skin necrosis: No Has patient had a PCN reaction that required hospitalization Yes Has patient had a PCN reaction occurring within the last 10 years: No If all of the above answers are "NO", then may proceed with Cephalosporin use.   . Shellfish Allergy Anaphylaxis    Respiratory arrest  . Tetracycline Anaphylaxis    Respiratory arrest  . Cephalexin Hives  . Iodinated Diagnostic Agents Hives  . Latex Hives  . Morphine Other (See Comments)    Possible resp arrest?? Patient is unsure  . Nitrofuran Derivatives Other (See Comments)    Unknown by patient  . Sulfa Antibiotics Hives    Family History  Problem Relation Age of Onset  . Hypertension Father   . Diabetes Mellitus II Father   . Diabetes Mellitus II Sister   . Asthma Mother     Prior to Admission medications   Medication Sig Start Date End Date Taking? Authorizing Provider  acetaminophen (TYLENOL) 500 MG tablet Take 1,000 mg by mouth every 6 (six) hours as needed for moderate pain.   Yes [provider]  albuterol (PROVENTIL HFA) 108 (90 Base) MCG/ACT inhaler Inhale 2 puffs into the lungs  every 6 (six) hours as needed for wheezing or shortness of breath.   Yes [provider]  albuterol (PROVENTIL) (2.5 MG/3ML) 0.083% nebulizer solution Take 2.5 mg by nebulization every 6 (six) hours as needed for wheezing or shortness of breath.   Yes [provider]  atorvastatin (LIPITOR) 40 MG tablet Take 40 mg by mouth daily.   Yes [provider]  chlorthalidone (HYGROTON) 25 MG tablet Take 25 mg by mouth daily.   Yes [provider]  clindamycin (CLEOCIN) 150 MG capsule Take 150 mg by mouth 3 (three) times daily.   Yes [provider]  clonazePAM (KLONOPIN) 0.5 MG tablet Take 0.5 mg by mouth 2 (two) times daily as needed for anxiety.   Yes [provider]  cloNIDine (CATAPRES - DOSED IN MG/24 HR) 0.2 mg/24hr patch Place 0.2 mg onto the skin once a week.   Yes [provider]  divalproex (DEPAKOTE ER) 500  MG 24 hr tablet Take 500 mg by mouth daily.   Yes [provider]  EPINEPHrine (EPIPEN 2-PAK) 0.3 mg/0.3 mL IJ SOAJ injection Inject 0.3 mg into the muscle once. For anaphylaxis   Yes [provider]  hydrALAZINE (APRESOLINE) 50 MG tablet Take 75 mg by mouth 3 (three) times daily.   Yes [provider]  HYDROcodone-acetaminophen (NORCO) 10-325 MG tablet Take 1 tablet by mouth every 6 (six) hours as needed for moderate pain.    Yes [provider]  levofloxacin (LEVAQUIN) 500 MG tablet Take 1 tablet (500 mg total) by mouth daily. 04/24/17  Yes Golden Circle, FNP  metoprolol tartrate (LOPRESSOR) 50 MG tablet Take 1 tablet (50 mg total) by mouth 2 (two) times daily. 02/11/17  Yes Matcha, Anupama, MD  NIFEdipine (PROCARDIA-XL/ADALAT CC) 60 MG 24 hr tablet Take 60 mg by mouth daily.   Yes [provider]  potassium chloride SA (K-DUR,KLOR-CON) 20 MEQ tablet Take 1 tablet (20 mEq total) by mouth daily. Take only when taking the Lasix 02/11/17  Yes Matcha, Anupama, MD  Vitamin D, Ergocalciferol,  (DRISDOL) 50000 units CAPS capsule Take 50,000 Units by mouth every Monday, Wednesday, and Friday.    Yes [provider]  spironolactone (ALDACTONE) 25 MG tablet Take 25 mg by mouth daily.    [provider]    Physical Exam:  Constitutional: Elderly female who appears to be NAD Vitals:   05/02/17 1751 05/02/17 2004 05/02/17 2301  BP: (!) 187/84 (!) 201/114 110/67  Pulse: 85 78 91  Resp: '18 14 16  ' Temp: 98.6 F (37 C)    TempSrc: Oral    SpO2: 99% 97% (!) 28%   Eyes: PERRL, lids and conjunctivae normal ENMT: Mucous membranes are moist. Posterior pharynx clear of any exudate or lesions.  Neck: normal, supple, no masses, no thyromegaly Respiratory: clear to auscultation bilaterally, no wheezing, no crackles. Normal respiratory effort. No accessory muscle use.  Cardiovascular: Regular rate and rhythm, no murmurs / rubs / gallops. No extremity edema. 2+ pedal pulses. No carotid bruits.  Abdomen: no tenderness, no masses palpated. No hepatosplenomegaly. Bowel sounds positive.  Musculoskeletal: no clubbing / cyanosis.  Joint effusion noted on the right knee. normal muscle tone.  Skin: 2 cm ulceration of the anterior aspect of the right leg proximal to the knee with purulent drainage present.  Erythema with increased warmth noted of the knee down distally. Neurologic: CN 2-12 grossly intact. Sensation intact, DTR normal. Strength 5/5 in all 4.  Psychiatric: Normal judgment and insight. Alert and oriented x 3. Normal mood.     Labs on Admission: I have personally reviewed following labs and imaging studies  CBC: Recent Labs  Lab 05/02/17 1954  WBC 10.3  NEUTROABS 5.8  HGB 9.8*  HCT 32.1*  MCV 76.2*  PLT 580*   Basic Metabolic Panel: Recent Labs  Lab 05/02/17 1954  NA 141  K 4.0  CL 108  CO2 25  GLUCOSE 102*  BUN 22*  CREATININE 0.78  CALCIUM 9.3   GFR: CrCl cannot be calculated (Unknown ideal weight.). Liver Function Tests: Recent Labs  Lab  05/02/17 1954  AST 21  ALT 13*  ALKPHOS 64  BILITOT 0.2*  PROT 6.8  ALBUMIN 3.6   No results for input(s): LIPASE, AMYLASE in the last 168 hours. No results for input(s): AMMONIA in the last 168 hours. Coagulation Profile: No results for input(s): INR, PROTIME in the last 168 hours. Cardiac Enzymes: No results for  input(s): CKTOTAL, CKMB, CKMBINDEX, TROPONINI in the last 168 hours. BNP (last 3 results) No results for input(s): PROBNP in the last 8760 hours. HbA1C: No results for input(s): HGBA1C in the last 72 hours. CBG: No results for input(s): GLUCAP in the last 168 hours. Lipid Profile: No results for input(s): CHOL, HDL, LDLCALC, TRIG, CHOLHDL, LDLDIRECT in the last 72 hours. Thyroid Function Tests: No results for input(s): TSH, T4TOTAL, FREET4, T3FREE, THYROIDAB in the last 72 hours. Anemia Panel: No results for input(s): VITAMINB12, FOLATE, FERRITIN, TIBC, IRON, RETICCTPCT in the last 72 hours. Urine analysis: No results found for: COLORURINE, APPEARANCEUR, LABSPEC, PHURINE, GLUCOSEU, HGBUR, BILIRUBINUR, KETONESUR, PROTEINUR, UROBILINOGEN, NITRITE, LEUKOCYTESUR Sepsis Labs: No results found for this or any previous visit (from the past 240 hour(s)).   Radiological Exams on Admission: Dg Tibia/fibula Right  Result Date: 05/02/2017 CLINICAL DATA:  Knee and lower leg swelling and erythema. Likely infection. EXAM: RIGHT TIBIA AND FIBULA - 2 VIEW COMPARISON:  None. FINDINGS: Soft tissue swelling is evident about the knee. Healed proximal fibular fracture is noted. Mild osteopenia is present. No other focal soft tissue abnormalities are present in the lower leg. The ankle joint is located. IMPRESSION: Proximal soft tissue swelling about the knee. Please see knee radiograph report of the same day. The more distal tibia and fibula demonstrate no acute bone or soft tissue abnormalities. Electronically Signed   By: San Morelle M.D.   On: 05/02/2017 20:33   Dg Knee Complete  4 Views Right  Result Date: 05/02/2017 CLINICAL DATA:  Knee and lower leg swelling and erythema. EXAM: RIGHT KNEE - COMPLETE 4+ VIEW COMPARISON:  Right knee radiographs 03/28/2017. FINDINGS: Lateral tibial plateau collapse and sclerosis of the lateral compartment of the knee is again seen. Changes of the lateral femoral condyle demonstrate some progression. There is a dressing or drain at the site of the previously noted ulcer. A moderate-sized joint effusion remains. No osseous destruction is evident. Proximal fibular remote fracture is noted. IMPRESSION: 1. Progressive sclerotic changes of the lateral femoral condyle. 2. Stable changes of the lateral tibial plateau. 3. No focal osseous destruction. 4. Persistent moderate effusion. 5. Drain or dressing in the anterior medial soft tissues of the lower leg. Electronically Signed   By: San Morelle M.D.   On: 05/02/2017 20:32      Assessment/Plan Right lower extremity cellulitis: Acute on chronic.  Patient presents with worsening erythema of the right lower extremity the last day.  Symptoms occurred despite patient being on Levaquin.  During previous admission patient was noted to have positive cultures for MSSA. - Admit to a telemetry - Cellulitis order set initiated - Follow-up blood cultures - Add on ESR and CRP - Continue vancomycin - Follow-up CT scan of the right knee - Will need to consult ID and orthopedics in a.m.   Microcytic hypochromic anemia: Chronic.  Patient's hemoglobin on admission 9.8, and over the last few months has stayed from 8-10 - Recheck CBC in a.m.   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  Initial systolic blood pressures noted to be elevated into the 200s.  Patient noted to have missed some of her nighttime medication doses. - Continue hydralazine, chlorthalidone, metoprolol, clonidine patch, and nifedipine  Anxiety - Continue Klonopin as needed for  anxiety  Hyperlipidemia - Continue atorvastatin   Thrombocytosis: Acute.  Initial platelet count 420.  Suspect secondary to above.  DVT prophylaxis: Lovenox Code Status: Full Family Communication: Discussed plan of care with  the patient family present at bedside Disposition Plan: Likely discharge home once medically stable Consults called: none  Admission status: inpatient  Norval Morton MD [facility] Pager [phone]   If 7PM-7AM, please contact night-coverage [email or web address] Password [email or web address]  05/02/2017, 11:43 PM

## 2017-05-02 NOTE — ED Provider Notes (Signed)
RLE chronic wound since December s/p pain injection. 2 days of erythema spreading and worsening pain.  Wound clinic yesterday. Sent here for Iv antibiotics and advanced imaging. Pending labs,   Failed outpatient therapy levaquin for 20 days. Afebrile Patient was admitted.     Emeline General, PA-C 05/03/17 0734    Hayden Rasmussen, MD 05/03/17 [phone]

## 2017-05-02 NOTE — ED Triage Notes (Signed)
Pt sent from wound care center for right lower leg wound. Note states pt is not improving w/ outpatient therapy and recommends pt have further imaging and IV abx.

## 2017-05-03 ENCOUNTER — Other Ambulatory Visit: Payer: Self-pay

## 2017-05-03 ENCOUNTER — Inpatient Hospital Stay (HOSPITAL_COMMUNITY): Payer: Medicare Other

## 2017-05-03 DIAGNOSIS — Z833 Family history of diabetes mellitus: Secondary | ICD-10-CM | POA: Diagnosis not present

## 2017-05-03 DIAGNOSIS — Z888 Allergy status to other drugs, medicaments and biological substances status: Secondary | ICD-10-CM

## 2017-05-03 DIAGNOSIS — T880XXD Infection following immunization, subsequent encounter: Secondary | ICD-10-CM

## 2017-05-03 DIAGNOSIS — Z886 Allergy status to analgesic agent status: Secondary | ICD-10-CM

## 2017-05-03 DIAGNOSIS — Z825 Family history of asthma and other chronic lower respiratory diseases: Secondary | ICD-10-CM | POA: Diagnosis not present

## 2017-05-03 DIAGNOSIS — K219 Gastro-esophageal reflux disease without esophagitis: Secondary | ICD-10-CM | POA: Diagnosis present

## 2017-05-03 DIAGNOSIS — I10 Essential (primary) hypertension: Secondary | ICD-10-CM | POA: Diagnosis present

## 2017-05-03 DIAGNOSIS — M25461 Effusion, right knee: Secondary | ICD-10-CM

## 2017-05-03 DIAGNOSIS — Z880 Allergy status to penicillin: Secondary | ICD-10-CM | POA: Diagnosis not present

## 2017-05-03 DIAGNOSIS — D509 Iron deficiency anemia, unspecified: Secondary | ICD-10-CM | POA: Diagnosis present

## 2017-05-03 DIAGNOSIS — Z8673 Personal history of transient ischemic attack (TIA), and cerebral infarction without residual deficits: Secondary | ICD-10-CM | POA: Diagnosis not present

## 2017-05-03 DIAGNOSIS — L03115 Cellulitis of right lower limb: Secondary | ICD-10-CM | POA: Diagnosis present

## 2017-05-03 DIAGNOSIS — Z91040 Latex allergy status: Secondary | ICD-10-CM | POA: Diagnosis not present

## 2017-05-03 DIAGNOSIS — E7800 Pure hypercholesterolemia, unspecified: Secondary | ICD-10-CM | POA: Diagnosis present

## 2017-05-03 DIAGNOSIS — Z885 Allergy status to narcotic agent status: Secondary | ICD-10-CM | POA: Diagnosis not present

## 2017-05-03 DIAGNOSIS — E559 Vitamin D deficiency, unspecified: Secondary | ICD-10-CM | POA: Diagnosis present

## 2017-05-03 DIAGNOSIS — Z8249 Family history of ischemic heart disease and other diseases of the circulatory system: Secondary | ICD-10-CM | POA: Diagnosis not present

## 2017-05-03 DIAGNOSIS — Z881 Allergy status to other antibiotic agents status: Secondary | ICD-10-CM | POA: Diagnosis not present

## 2017-05-03 DIAGNOSIS — M1990 Unspecified osteoarthritis, unspecified site: Secondary | ICD-10-CM | POA: Diagnosis present

## 2017-05-03 DIAGNOSIS — R7303 Prediabetes: Secondary | ICD-10-CM | POA: Diagnosis present

## 2017-05-03 DIAGNOSIS — L97819 Non-pressure chronic ulcer of other part of right lower leg with unspecified severity: Secondary | ICD-10-CM | POA: Diagnosis present

## 2017-05-03 DIAGNOSIS — Z91013 Allergy to seafood: Secondary | ICD-10-CM

## 2017-05-03 DIAGNOSIS — Z882 Allergy status to sulfonamides status: Secondary | ICD-10-CM | POA: Diagnosis not present

## 2017-05-03 DIAGNOSIS — Z90710 Acquired absence of both cervix and uterus: Secondary | ICD-10-CM | POA: Diagnosis not present

## 2017-05-03 DIAGNOSIS — F419 Anxiety disorder, unspecified: Secondary | ICD-10-CM | POA: Diagnosis present

## 2017-05-03 DIAGNOSIS — Z91041 Radiographic dye allergy status: Secondary | ICD-10-CM | POA: Diagnosis not present

## 2017-05-03 DIAGNOSIS — Z91010 Allergy to peanuts: Secondary | ICD-10-CM | POA: Diagnosis not present

## 2017-05-03 DIAGNOSIS — G4700 Insomnia, unspecified: Secondary | ICD-10-CM | POA: Diagnosis present

## 2017-05-03 HISTORY — DX: Cellulitis of right lower limb: L03.115

## 2017-05-03 LAB — BASIC METABOLIC PANEL
ANION GAP: 10 (ref 5–15)
BUN: 17 mg/dL (ref 6–20)
CHLORIDE: 108 mmol/L (ref 101–111)
CO2: 22 mmol/L (ref 22–32)
Calcium: 9.2 mg/dL (ref 8.9–10.3)
Creatinine, Ser: 0.7 mg/dL (ref 0.44–1.00)
GFR calc Af Amer: >60 mL/min (ref >60)
GLUCOSE: 115 mg/dL — AB (ref 65–99)
POTASSIUM: 3.5 mmol/L (ref 3.5–5.1)
Sodium: 140 mmol/L (ref 135–145)

## 2017-05-03 LAB — SYNOVIAL CELL COUNT + DIFF, W/ CRYSTALS
CRYSTALS FLUID: NONE SEEN
Eosinophils-Synovial: 2 % — ABNORMAL HIGH (ref 0–1)
Lymphocytes-Synovial Fld: 20 % (ref 0–20)
Monocyte-Macrophage-Synovial Fluid: 29 % — ABNORMAL LOW (ref 50–90)
Neutrophil, Synovial: 49 % — ABNORMAL HIGH (ref 0–25)
WBC, SYNOVIAL: 680 /mm3 — AB (ref 0–200)

## 2017-05-03 LAB — SEDIMENTATION RATE: Sed Rate: 20 mm/hr (ref 0–22)

## 2017-05-03 LAB — C-REACTIVE PROTEIN

## 2017-05-03 LAB — CBC
HCT: 36 % (ref 36.0–46.0)
HEMOGLOBIN: 10.9 g/dL — AB (ref 12.0–15.0)
MCH: 23 pg — AB (ref 26.0–34.0)
MCHC: 30.3 g/dL (ref 30.0–36.0)
MCV: 75.9 fL — AB (ref 78.0–100.0)
Platelets: 482 10*3/uL — ABNORMAL HIGH (ref 150–400)
RBC: 4.74 MIL/uL (ref 3.87–5.11)
RDW: 16.9 % — ABNORMAL HIGH (ref 11.5–15.5)
WBC: 12.3 10*3/uL — AB (ref 4.0–10.5)

## 2017-05-03 LAB — MRSA PCR SCREENING: MRSA by PCR: NEGATIVE

## 2017-05-03 MED ORDER — HYDRALAZINE HCL 50 MG PO TABS
75.0000 mg | ORAL_TABLET | Freq: Three times a day (TID) | ORAL | Status: DC
  Administered 2017-05-03 – 2017-05-07 (×13): 75 mg via ORAL
  Filled 2017-05-03 (×13): qty 1

## 2017-05-03 MED ORDER — METOPROLOL TARTRATE 50 MG PO TABS
50.0000 mg | ORAL_TABLET | Freq: Two times a day (BID) | ORAL | Status: DC
  Administered 2017-05-03 – 2017-05-07 (×9): 50 mg via ORAL
  Filled 2017-05-03 (×10): qty 1

## 2017-05-03 MED ORDER — ALBUTEROL SULFATE (2.5 MG/3ML) 0.083% IN NEBU: 2.5000 mg | INHALATION_SOLUTION | Freq: Four times a day (QID) | RESPIRATORY_TRACT | Status: DC | PRN

## 2017-05-03 MED ORDER — POTASSIUM CHLORIDE CRYS ER 20 MEQ PO TBCR
20.0000 meq | EXTENDED_RELEASE_TABLET | Freq: Every day | ORAL | Status: DC
  Administered 2017-05-03: 20 meq via ORAL
  Filled 2017-05-03: qty 1

## 2017-05-03 MED ORDER — HYDROCODONE-ACETAMINOPHEN 10-325 MG PO TABS
1.0000 | ORAL_TABLET | Freq: Four times a day (QID) | ORAL | Status: DC | PRN
  Administered 2017-05-03 – 2017-05-07 (×14): 1 via ORAL
  Filled 2017-05-03 (×15): qty 1

## 2017-05-03 MED ORDER — ADULT MULTIVITAMIN W/MINERALS CH
1.0000 | ORAL_TABLET | Freq: Every day | ORAL | Status: DC
  Administered 2017-05-03 – 2017-05-07 (×5): 1 via ORAL
  Filled 2017-05-03 (×5): qty 1

## 2017-05-03 MED ORDER — ACETAMINOPHEN 650 MG RE SUPP
650.0000 mg | Freq: Four times a day (QID) | RECTAL | Status: DC | PRN
Start: 2017-05-03 — End: 2017-05-07

## 2017-05-03 MED ORDER — CLONAZEPAM 0.5 MG PO TABS
0.5000 mg | ORAL_TABLET | Freq: Two times a day (BID) | ORAL | Status: DC | PRN
  Administered 2017-05-04: 0.5 mg via ORAL
  Filled 2017-05-03 (×2): qty 1

## 2017-05-03 MED ORDER — ENSURE ENLIVE PO LIQD
237.0000 mL | Freq: Two times a day (BID) | ORAL | Status: DC
  Administered 2017-05-03 – 2017-05-07 (×9): 237 mL via ORAL

## 2017-05-03 MED ORDER — HYDRALAZINE HCL 20 MG/ML IJ SOLN
10.0000 mg | Freq: Once | INTRAMUSCULAR | Status: AC
  Administered 2017-05-03: 10 mg via INTRAVENOUS
  Filled 2017-05-03: qty 1

## 2017-05-03 MED ORDER — CHLORTHALIDONE 25 MG PO TABS
25.0000 mg | ORAL_TABLET | Freq: Every day | ORAL | Status: DC
  Administered 2017-05-03 – 2017-05-07 (×5): 25 mg via ORAL
  Filled 2017-05-03 (×5): qty 1

## 2017-05-03 MED ORDER — ONDANSETRON HCL 4 MG/2ML IJ SOLN: 4.0000 mg | Freq: Four times a day (QID) | INTRAMUSCULAR | Status: DC | PRN

## 2017-05-03 MED ORDER — ENOXAPARIN SODIUM 40 MG/0.4ML ~~LOC~~ SOLN
40.0000 mg | Freq: Every day | SUBCUTANEOUS | Status: DC
  Administered 2017-05-03 – 2017-05-06 (×5): 40 mg via SUBCUTANEOUS
  Filled 2017-05-03 (×5): qty 0.4

## 2017-05-03 MED ORDER — LABETALOL HCL 5 MG/ML IV SOLN
5.0000 mg | INTRAVENOUS | Status: DC | PRN
  Filled 2017-05-03: qty 4

## 2017-05-03 MED ORDER — NIFEDIPINE ER 60 MG PO TB24
60.0000 mg | ORAL_TABLET | Freq: Every day | ORAL | Status: DC
  Administered 2017-05-03 – 2017-05-07 (×5): 60 mg via ORAL
  Filled 2017-05-03 (×5): qty 1

## 2017-05-03 MED ORDER — SODIUM CHLORIDE 0.9 % IV SOLN
1250.0000 mg | INTRAVENOUS | Status: DC
  Administered 2017-05-03: 1250 mg via INTRAVENOUS
  Filled 2017-05-03: qty 1250

## 2017-05-03 MED ORDER — ATORVASTATIN CALCIUM 40 MG PO TABS
40.0000 mg | ORAL_TABLET | Freq: Every day | ORAL | Status: DC
  Administered 2017-05-03 – 2017-05-07 (×5): 40 mg via ORAL
  Filled 2017-05-03 (×5): qty 1

## 2017-05-03 MED ORDER — DIVALPROEX SODIUM ER 500 MG PO TB24
500.0000 mg | ORAL_TABLET | Freq: Every day | ORAL | Status: DC
  Administered 2017-05-03 – 2017-05-07 (×5): 500 mg via ORAL
  Filled 2017-05-03 (×5): qty 1

## 2017-05-03 MED ORDER — CLONIDINE HCL 0.2 MG/24HR TD PTWK: 0.2000 mg | MEDICATED_PATCH | TRANSDERMAL | Status: DC

## 2017-05-03 MED ORDER — ACETAMINOPHEN 325 MG PO TABS
650.0000 mg | ORAL_TABLET | Freq: Four times a day (QID) | ORAL | Status: DC | PRN
  Administered 2017-05-03 – 2017-05-07 (×5): 650 mg via ORAL
  Filled 2017-05-03 (×5): qty 2

## 2017-05-03 MED ORDER — ONDANSETRON HCL 4 MG PO TABS: 4.0000 mg | ORAL_TABLET | Freq: Four times a day (QID) | ORAL | Status: DC | PRN

## 2017-05-03 NOTE — Progress Notes (Signed)
Initial Nutrition Assessment  DOCUMENTATION CODES:   Not applicable  INTERVENTION:    Ensure Enlive po BID, each supplement provides 350 kcal and 20 grams of protein  Provide MVI daily  NUTRITION DIAGNOSIS:   Increased nutrient needs related to wound healing as evidenced by estimated needs.  GOAL:   Patient will meet greater than or equal to 90% of their needs  MONITOR:   PO intake, Supplement acceptance, Weight trends, Labs, Skin, I & O's  REASON FOR ASSESSMENT:   Malnutrition Screening Tool    ASSESSMENT:   Patient with PMH significant for HTN, chronic right lower extremity wound, cellulitis, chronic pain, anemia. Last admitted to the hospital from 1/1-1/6 for sepsis thought to be secondary to right lower extremity cellulitis with wound cultures positive for MSSA. Presents this admission with worsening wound and erythema of the right lower extremity.    Pt drowsy upon assessment. Reports PO intake started decreasing two weeks ago due to feelings of nausea. She would consume one meal per day that consisted of "different things". Unable to elaborate. Pt does not consume supplementation at home. She refused breakfast this morning because she "wasn't hungry." RD observed Ensure at bedside 50% completed. Pt would like to continue with these.   Pt endorses a UBW of 135 lb and a wt loss of two pounds in two weeks. Records indicate pt weighed 123 lb on 3/7 and 132 lb this stay. Do not suspect any recent wt loss. Nutrition-Focused physical exam completed. Pt shows to have fluid accumulation on right knee. [place] nurse recommends ortho service.     Medications reviewed and include: 20 mEq KCl, IV abx Labs reviewed.   NUTRITION - FOCUSED PHYSICAL EXAM:    Most Recent Value  Orbital Region  No depletion  Upper Arm Region  No depletion  Thoracic and Lumbar Region  Unable to assess  Buccal Region  No depletion  Temple Region  Moderate depletion  Clavicle Bone Region  No depletion   Clavicle and Acromion Bone Region  No depletion  Scapular Bone Region  Unable to assess  Dorsal Hand  No depletion  Patellar Region  No depletion  Anterior Thigh Region  No depletion  Posterior Calf Region  No depletion  Edema (RD Assessment)  Mild     Diet Order:  Diet Heart Room service appropriate? Yes; Fluid consistency: Thin  EDUCATION NEEDS:   Education needs have been addressed  Skin:  Skin Assessment: Skin Integrity Issues: Skin Integrity Issues:: Other (Comment) Other: open wound right leg  Last BM:  PTA  Height:   Ht Readings from Last 1 Encounters:  05/03/17 5\' 3"  (1.6 m)    Weight:   Wt Readings from Last 1 Encounters:  05/03/17 132 lb 4.4 oz (60 kg)    Ideal Body Weight:  52.2 kg  BMI:  Body mass index is 23.43 kg/m.  Estimated Nutritional Needs:   Kcal:  1500-1700 kcal/day  Protein:  75-85 g/day  Fluid:  >1.5 L/day    Mariana Single RD, LDN Clinical Nutrition Pager # - [phone]

## 2017-05-03 NOTE — Progress Notes (Signed)
PROGRESS NOTE    [name]  KNL:[id number] DOB: 1945/05/12 DOA: 05/02/2017 PCP: Tish Frederickson, [facility]    Brief Narrative:  Cortnie Howe is a 72 y.o. 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 Patient has had her right lower extremity wound since December of 2018 that she noted after having a joint injection of right knee due to arthritis. She was last admitted to the hospital from 1/1-1/6 for sepsis thought to be secondary to right lower extremity cellulitis with wound cultures positive for MSSA.  Patient was initially treated with vancomycin.  ID was consulted and patient was changed to clindamycin at discharge. She had been going to the wound care center and was being treated with Levaquin for the last 20 days of 30-day course.  The patient's husband had been changing out the dressing as previously advised.  Over the last 24-48 hours patient reports acute redness of the right lower extremity with worsening drainage from the wound.  She had followed up with the wound care center and was sent over for need of IV antibiotics and further imaging as there was concern for osteomyelitis.  CT right tibia/fibula revealed diffuse synovial thickening with moderate joint effusion as seen on the prior CT consistent with an infectious or inflammatory synovitis. Blood cultures were obtained and patient was started on IV antibiotics of vancomycin.   Assessment & Plan:   Principal Problem:   Cellulitis of right lower extremity Active Problems:   Essential hypertension   Microcytic hypochromic anemia   Anxiety   Right lower extremity cellulitis -Has been ongoing since December 2018.  Due to multiple drug allergies, patient was treated with clindamycin last hospitalization for wound culture positive for MSSA.  She was started on Levaquin  as an outpatient and has been on tx for about 3 weeks.  She now returns with worsening erythema, drainage from the wound. -CT tibia-fibula revealed moderate joint effusion. -CRP < 0.8  -Wound RN consulted  -Superficial wound culture of right knee obtained in ED, pending  -Blood cultures pending  -Currently on vancomycin, infectious disease consulted for antibiotic assistance -Orthopedic surgery consulted for possible joint aspiration  Hypertensive urgency -Continue hydralazine, chlorthalidone, metoprolol, clonidine patch, and nifedipine -BP improved this morning  Anxiety -Continue Klonopin as needed for anxiety  Hyperlipidemia -Continue atorvastatin   DVT prophylaxis: Lovenox Code Status: Full Family Communication: At bedside Disposition Plan: Pending clinical improvement, orthopedic surgery and ID consultation   Consultants:   ID  Orthopedic surgery   Procedures:   None   Antimicrobials:  Anti-infectives (From admission, onward)   Start     Dose/Rate Route Frequency Ordered Stop   05/04/17 0000  vancomycin (VANCOCIN) 1,250 mg in sodium chloride 0.9 % 250 mL IVPB     1,250 mg 166.7 mL/hr over 90 Minutes Intravenous Every 36 hours 05/03/17 0525     05/02/17 2330  vancomycin (VANCOCIN) IVPB 1000 mg/200 mL premix     1,000 mg 200 mL/hr over 60 Minutes Intravenous  Once 05/02/17 2323 05/03/17 0423        Subjective: Had fever as high as 101 over the last 3 weeks, had worsening redness and draining from the right knee wound.  Objective: Vitals:   05/03/17 0321 05/03/17 0331 05/03/17 0619 05/03/17 0912  BP: (!) 219/98 (!) 215/89 (!) 128/115 (!) 122/98  Pulse: 91   88  Resp:  16     Temp: 98.4 F (36.9 C)     TempSrc: Oral     SpO2: 95%     Weight: 60 kg (132 lb 4.4 oz)     Height: 5\' 3"  (1.6 m)       Intake/Output Summary (Last 24 hours) at 05/03/2017 1125 Last data filed at 05/03/2017 4580 Gross per 24 hour  Intake 200 ml  Output 900 ml  Net -700 ml     Filed Weights   05/03/17 0000 05/03/17 0321  Weight: 55.8 kg (123 lb) 60 kg (132 lb 4.4 oz)    Examination:  General exam: Appears calm and comfortable  Respiratory system: Clear to auscultation. Respiratory effort normal. Cardiovascular system: S1 & S2 heard, RRR. No JVD, murmurs, rubs, gallops or clicks. No pedal edema. Gastrointestinal system: Abdomen is nondistended, soft and nontender. No organomegaly or masses felt. Normal bowel sounds heard. Central nervous system: Alert and oriented. No focal neurological deficits. Extremities: Right knee with joint effusion laterally Skin: +Erythema distal to right knee, with wound packed, bilateral LE stasis with erythema  Psychiatry: Judgement and insight appear normal. Mood & affect appropriate.   Data Reviewed: I have personally reviewed following labs and imaging studies  CBC: Recent Labs  Lab 05/02/17 1954 05/03/17 0522  WBC 10.3 12.3*  NEUTROABS 5.8  --   HGB 9.8* 10.9*  HCT 32.1* 36.0  MCV 76.2* 75.9*  PLT 420* 998*   Basic Metabolic Panel: Recent Labs  Lab 05/02/17 1954 05/03/17 0522  NA 141 140  K 4.0 3.5  CL 108 108  CO2 25 22  GLUCOSE 102* 115*  BUN 22* 17  CREATININE 0.78 0.70  CALCIUM 9.3 9.2   GFR: Estimated Creatinine Clearance: 53.4 mL/min (by C-G formula based on SCr of 0.7 mg/dL). Liver Function Tests: Recent Labs  Lab 05/02/17 1954  AST 21  ALT 13*  ALKPHOS 64  BILITOT 0.2*  PROT 6.8  ALBUMIN 3.6   No results for input(s): LIPASE, AMYLASE in the last 168 hours. No results for input(s): AMMONIA in the last 168 hours. Coagulation Profile: No results for input(s): INR, PROTIME in the last 168 hours. Cardiac Enzymes: No results for input(s): CKTOTAL, CKMB, CKMBINDEX, TROPONINI in the last 168 hours. BNP (last 3 results) No results for input(s): PROBNP in the last 8760 hours. HbA1C: No results for input(s): HGBA1C in the last 72 hours. CBG: No results for input(s): GLUCAP in the last 168  hours. Lipid Profile: No results for input(s): CHOL, HDL, LDLCALC, TRIG, CHOLHDL, LDLDIRECT in the last 72 hours. Thyroid Function Tests: No results for input(s): TSH, T4TOTAL, FREET4, T3FREE, THYROIDAB in the last 72 hours. Anemia Panel: No results for input(s): VITAMINB12, FOLATE, FERRITIN, TIBC, IRON, RETICCTPCT in the last 72 hours. Sepsis Labs: No results for input(s): PROCALCITON, LATICACIDVEN in the last 168 hours.  Recent Results (from the past 240 hour(s))  Wound or Superficial Culture     Status: None (Preliminary result)   Collection Time: 05/02/17 10:55 PM  Result Value Ref Range Status   Specimen Description   Final    WOUND RIGHT KNEE Performed at [place] [address], [place], [place] [address]    Special Requests   Final    NONE Performed at [facility], [place] [address] [place], [place] [address]    Gram Stain   Final    FEW WBC PRESENT, PREDOMINANTLY PMN FEW GRAM POSITIVE COCCI IN PAIRS RARE GRAM VARIABLE ROD Performed  at [facility], [place] [address], [place], [place] [address]    Culture PENDING  Incomplete   Report Status PENDING  Incomplete  MRSA PCR Screening     Status: None   Collection Time: 05/02/17 11:14 PM  Result Value Ref Range Status   MRSA by PCR NEGATIVE NEGATIVE Final    Comment:        The GeneXpert MRSA Assay (FDA approved for NASAL specimens only), is one component of a comprehensive MRSA colonization surveillance program. It is not intended to diagnose MRSA infection nor to guide or monitor treatment for MRSA infections. Performed at [facility], [place] [address], [place], [place] [address]        Radiology Studies: Dg Tibia/fibula Right  Result Date: 05/02/2017 CLINICAL DATA:  Knee and lower leg swelling and erythema. Likely infection. EXAM: RIGHT TIBIA AND FIBULA - 2 VIEW COMPARISON:  None. FINDINGS: Soft tissue swelling is evident about the knee. Healed  proximal fibular fracture is noted. Mild osteopenia is present. No other focal soft tissue abnormalities are present in the lower leg. The ankle joint is located. IMPRESSION: Proximal soft tissue swelling about the knee. Please see knee radiograph report of the same day. The more distal tibia and fibula demonstrate no acute bone or soft tissue abnormalities. Electronically Signed   By: San Morelle M.D.   On: 05/02/2017 20:33   Ct Knee Right Wo Contrast  Result Date: 05/03/2017 CLINICAL DATA:  72 year old female with persistent wound inferior to the right knee and poor wound healing. Concern for osteomyelitis. EXAM: CT OF THE right KNEE WITHOUT CONTRAST TECHNIQUE: Multidetector CT imaging of the right knee was performed according to the standard protocol. Multiplanar CT image reconstructions were also generated. COMPARISON:  Right knee radiograph dated 05/02/2017 and CT dated 02/07/2017 FINDINGS: Evaluation of this exam is limited in the absence of intravenous contrast. Bones/Joint/Cartilage There is no acute fracture or dislocation. Old healed fracture of the proximal third of the fibular diaphysis. No periosteal elevation. Severe arthritic changes of the knee with sclerotic changes involving the lateral compartment along the lateral tibial plateau and femoral condyle. There is valgus tilt of the knee as seen on the prior CT. There is multiple osteophytes and bone spur. There is diffuse thickening of the synovium and knee capsule with moderate amount of joint effusion which is concerning for an infectious process, possibly chronic or an inflammatory synovitis. Correlation with clinical exam and joint fluid analysis recommended. There is thickening of the soft tissues inferior to the medial knee with a focal area of skin defect consistent with known ulcer/wound. Ligaments Suboptimally assessed by CT. Muscles and Tendons No intramuscular gas or fluid collection. Soft tissues Diffuse subcutaneous edema and  subcutaneous soft tissue thickening and skin ulcer inferior to the medial knee. IMPRESSION: 1. Diffuse synovial thickening with moderate joint effusion as seen on the prior CT consistent with an infectious or inflammatory synovitis. Correlation with clinical exam and joint fluid analysis recommended. 2. Severe arthropathy of the knee with valgus tilt similar to prior CT. 3. Skin wound inferior to the medial knee. 4. No acute fracture or dislocation.  Osteopenia. Electronically Signed   By: Anner Crete M.D.   On: 05/03/2017 01:08   Ct Tibia Fibula Right Wo Contrast  Result Date: 05/03/2017 CLINICAL DATA:  72 year old female with persistent wound inferior to the right knee and poor wound healing. Concern for osteomyelitis. EXAM: CT OF THE right KNEE WITHOUT CONTRAST TECHNIQUE: Multidetector CT imaging of  the right knee was performed according to the standard protocol. Multiplanar CT image reconstructions were also generated. COMPARISON:  Right knee radiograph dated 05/02/2017 and CT dated 02/07/2017 FINDINGS: Evaluation of this exam is limited in the absence of intravenous contrast. Bones/Joint/Cartilage There is no acute fracture or dislocation. Old healed fracture of the proximal third of the fibular diaphysis. No periosteal elevation. Severe arthritic changes of the knee with sclerotic changes involving the lateral compartment along the lateral tibial plateau and femoral condyle. There is valgus tilt of the knee as seen on the prior CT. There is multiple osteophytes and bone spur. There is diffuse thickening of the synovium and knee capsule with moderate amount of joint effusion which is concerning for an infectious process, possibly chronic or an inflammatory synovitis. Correlation with clinical exam and joint fluid analysis recommended. There is thickening of the soft tissues inferior to the medial knee with a focal area of skin defect consistent with known ulcer/wound. Ligaments Suboptimally assessed by  CT. Muscles and Tendons No intramuscular gas or fluid collection. Soft tissues Diffuse subcutaneous edema and subcutaneous soft tissue thickening and skin ulcer inferior to the medial knee. IMPRESSION: 1. Diffuse synovial thickening with moderate joint effusion as seen on the prior CT consistent with an infectious or inflammatory synovitis. Correlation with clinical exam and joint fluid analysis recommended. 2. Severe arthropathy of the knee with valgus tilt similar to prior CT. 3. Skin wound inferior to the medial knee. 4. No acute fracture or dislocation.  Osteopenia. Electronically Signed   By: Anner Crete M.D.   On: 05/03/2017 01:08   Dg Knee Complete 4 Views Right  Result Date: 05/02/2017 CLINICAL DATA:  Knee and lower leg swelling and erythema. EXAM: RIGHT KNEE - COMPLETE 4+ VIEW COMPARISON:  Right knee radiographs 03/28/2017. FINDINGS: Lateral tibial plateau collapse and sclerosis of the lateral compartment of the knee is again seen. Changes of the lateral femoral condyle demonstrate some progression. There is a dressing or drain at the site of the previously noted ulcer. A moderate-sized joint effusion remains. No osseous destruction is evident. Proximal fibular remote fracture is noted. IMPRESSION: 1. Progressive sclerotic changes of the lateral femoral condyle. 2. Stable changes of the lateral tibial plateau. 3. No focal osseous destruction. 4. Persistent moderate effusion. 5. Drain or dressing in the anterior medial soft tissues of the lower leg. Electronically Signed   By: San Morelle M.D.   On: 05/02/2017 20:32      Scheduled Meds: . atorvastatin  40 mg Oral Daily  . chlorthalidone  25 mg Oral Daily  . [START ON 05/09/2017] cloNIDine  0.2 mg Transdermal Weekly  . divalproex  500 mg Oral Daily  . enoxaparin (LOVENOX) injection  40 mg Subcutaneous QHS  . feeding supplement (ENSURE ENLIVE)  237 mL Oral BID BM  . hydrALAZINE  75 mg Oral TID  . metoprolol tartrate  50 mg Oral BID   . multivitamin with minerals  1 tablet Oral Daily  . NIFEdipine  60 mg Oral Daily  . potassium chloride SA  20 mEq Oral Daily   Continuous Infusions: . [START ON 05/04/2017] vancomycin       LOS: 0 days    Time spent: 35 minutes   Dessa Phi, DO [facility] [email or web address] Password [facility] 05/03/2017, 11:25 AM

## 2017-05-03 NOTE — Progress Notes (Addendum)
CRITICAL VALUE ALERT  Critical Value:Wound culture FEW WBC PRESENT, PREDOMINANTLY PMN  FEW GRAM POSITIVE COCCI IN PAIRS  RARE GRAM VARIABLE ROD   Date & Time Notied:  05/03/17  Provider Notified: Dr Maylene Roes  Orders Received/Actions taken:

## 2017-05-03 NOTE — Progress Notes (Signed)
Pharmacy Antibiotic Note  Tamara Howe is a 72 y.o. female admitted on 05/02/2017 with cellulitis.  Pharmacy has been consulted for vancomycin dosing.  Plan: Vancomycin 1gm iv x1, then 1250mg  iv q36hr Goal AUC = 400 - 500 for all indications, except meningitis (goal AUC > 500 and Cmin 15-20 mcg/mL)   Height: 5\' 3"  (160 cm) Weight: 132 lb 4.4 oz (60 kg) IBW/kg (Calculated) : 52.4  Temp (24hrs), Avg:98.5 F (36.9 C), Min:98.4 F (36.9 C), Max:98.6 F (37 C)  Recent Labs  Lab 05/02/17 1954  WBC 10.3  CREATININE 0.78    Estimated Creatinine Clearance: 53.4 mL/min (by C-G formula based on SCr of 0.78 mg/dL).    Allergies  Allergen Reactions  . Aspirin Shortness Of Breath    respiratory distress  . Codeine Nausea Only  . Peanut (Diagnostic) Anaphylaxis    hives and wheezing, anaphylaxis  . Penicillins Anaphylaxis    Has patient had a PCN reaction causing immediate rash, facial/tongue/throat swelling, SOB or lightheadedness with hypotension: Yes Has patient had a PCN reaction causing severe rash involving mucus membranes or skin necrosis: No Has patient had a PCN reaction that required hospitalization Yes Has patient had a PCN reaction occurring within the last 10 years: No If all of the above answers are "NO", then may proceed with Cephalosporin use.   . Shellfish Allergy Anaphylaxis    Respiratory arrest  . Tetracycline Anaphylaxis    Respiratory arrest  . Cephalexin Hives  . Iodinated Diagnostic Agents Hives  . Latex Hives  . Morphine Other (See Comments)    Possible resp arrest?? Patient is unsure  . Nitrofuran Derivatives Other (See Comments)    Unknown by patient  . Sulfa Antibiotics Hives    Antimicrobials this admission: Vancomycin 05/03/2017 >>   Dose adjustments this admission: -  Microbiology results: -  Thank you for allowing pharmacy to be a part of this patient's care.  Tamara Howe 05/03/2017 5:26 AM

## 2017-05-03 NOTE — Consult Note (Signed)
[place] for Infectious Disease       Reason for Consult: cellulitis and knee effusion    Referring Physician: Dr. Maylene Roes  Principal Problem:   Cellulitis of right lower extremity Active Problems:   Essential hypertension   Microcytic hypochromic anemia   Anxiety   . atorvastatin  40 mg Oral Daily  . chlorthalidone  25 mg Oral Daily  . [START ON 05/09/2017] cloNIDine  0.2 mg Transdermal Weekly  . divalproex  500 mg Oral Daily  . enoxaparin (LOVENOX) injection  40 mg Subcutaneous QHS  . feeding supplement (ENSURE ENLIVE)  237 mL Oral BID BM  . hydrALAZINE  75 mg Oral TID  . metoprolol tartrate  50 mg Oral BID  . multivitamin with minerals  1 tablet Oral Daily  . NIFEdipine  60 mg Oral Daily    Recommendations: Knee aspiration - done Continue vancomycin for now pending effusion evaluation   Assessment: She has had a recent wound infection with cellulitis and has had continued knee pain.  On her exam now, there is no signs of active cellulitis, redness but not c/w cellulitis.  No particular drainage from her wound.  Therefore I do not feel she needs continued treatment for cellulitis.  imaging does not suggest any osteomyelitis deep. She does have an effusion that has been present since her last admission and may be septic arthritis.  Tamara Howe has already aspirated this so will wait for results and if prolonged treatment for septic arthritis indicated.    Antibiotics: vancomcyin day 2  HPI: Tamara Howe is a 72 y.o. female with a chronic lower extremity wound after a steroid injection in December of 2018 and was seen in January for cellulitis and some drainage in the area with culture growth of MSSA.  Due to multiple allergies, she was placed on clindamycin.  She was seen in follow up by my partner NP, G Calone and had been placed on levaquin for infection after an ED visit.  She reported improvement so he decided to continue the levaquin.  She was having issues with  chills, nausea and concern for increased erythema at the time. His evaluation, which was 1 week ago, did find some erythema on the distal aspect of the femur as well.  She came in now after being sent in from the wound care center with concern for osteomyelitis due to poorly healing wound.  CT was done and no concerns for osteomyelitis on the scan noted.  She did have a persistent effusion.  She was placed on empiric vancomycin  CT independently reviewed and no bone destruction noted.    Review of Systems:  Constitutional: negative for malaise and anorexia Cardiovascular: negative for dyspnea Integument/breast: negative for rash All other systems reviewed and are negative    Past Medical History:  Diagnosis Date  . Abdominal mass, LLQ (left lower quadrant)   . Anemia   . Anxiety   . Asthma   . Bladder infection, chronic   . Cellulitis and abscess of leg 02/08/2017  . Chest pain   . Chronic pain   . Complication of anesthesia    "stopped breathing with tumor removal surgery, not happened since then"  . Constipation   . Cough   . Depression   . Dietary counseling   . Dysuria   . Edema extremities   . Enthesopathy of knee   . Esophagitis   . GERD (gastroesophageal reflux disease)   . Headache   . Heart murmur   .  High risk medication use   . History of bronchitis   . Hypercholesterolemia   . Hypertension   . Insomnia   . Ischemic cerebrovascular accident (CVA) of frontal lobe ([place])   . Lipoma of skin   . Low back pain   . Malaise and fatigue   . Nocturia   . Noncompliance with medications   . Osteoarthritis   . PONV (postoperative nausea and vomiting)   . Prediabetes   . Rash   . Shortness of breath dyspnea   . Vitamin D deficiency     Social History   Tobacco Use  . Smoking status: Never Smoker  . Smokeless tobacco: Never Used  Substance Use Topics  . Alcohol use: No  . Drug use: No    Family History  Problem Relation Age of Onset  . Hypertension Father     . Diabetes Mellitus II Father   . Diabetes Mellitus II Sister   . Asthma Mother     Allergies  Allergen Reactions  . Aspirin Shortness Of Breath    respiratory distress  . Codeine Nausea Only  . Peanut (Diagnostic) Anaphylaxis    hives and wheezing, anaphylaxis  . Penicillins Anaphylaxis    Has patient had a PCN reaction causing immediate rash, facial/tongue/throat swelling, SOB or lightheadedness with hypotension: Yes Has patient had a PCN reaction causing severe rash involving mucus membranes or skin necrosis: No Has patient had a PCN reaction that required hospitalization Yes Has patient had a PCN reaction occurring within the last 10 years: No If all of the above answers are "NO", then may proceed with Cephalosporin use.   . Shellfish Allergy Anaphylaxis    Respiratory arrest  . Tetracycline Anaphylaxis    Respiratory arrest  . Cephalexin Hives  . Iodinated Diagnostic Agents Hives  . Latex Hives  . Morphine Other (See Comments)    Possible resp arrest?? Patient is unsure  . Nitrofuran Derivatives Other (See Comments)    Unknown by patient  . Sulfa Antibiotics Hives    Physical Exam: Constitutional: in no apparent distress and alert  Vitals:   05/03/17 0912 05/03/17 1343  BP: (!) 122/98 (!) 107/91  Pulse: 88 69  Resp:  18  Temp:  98.3 F (36.8 C)  SpO2:  99%   EYES: anicteric ENMT: no thrush Cardiovascular: Cor RRR Respiratory: CTA B; normal respiratory effort GI: Bowel sounds are normal, liver is not enlarged, spleen is not enlarged Musculoskeletal: no pedal edema noted; left leg with open wound under patella, no significant drainage now; some mild erythema but no warmth, not bright red, no tenderness.  Skin: negatives: no rash Hematologic: no cervical lad  Lab Results  Component Value Date   WBC 12.3 (H) 05/03/2017   HGB 10.9 (L) 05/03/2017   HCT 36.0 05/03/2017   MCV 75.9 (L) 05/03/2017   PLT 482 (H) 05/03/2017    Lab Results  Component Value Date    CREATININE 0.70 05/03/2017   BUN 17 05/03/2017   NA 140 05/03/2017   K 3.5 05/03/2017   CL 108 05/03/2017   CO2 22 05/03/2017    Lab Results  Component Value Date   ALT 13 (L) 05/02/2017   AST 21 05/02/2017   ALKPHOS 64 05/02/2017     Microbiology: Recent Results (from the past 240 hour(s))  Wound or Superficial Culture     Status: None (Preliminary result)   Collection Time: 05/02/17 10:55 PM  Result Value Ref Range Status   Specimen Description  Final    WOUND RIGHT KNEE Performed at [facility] [address], [place], [place] [address]    Special Requests   Final    NONE Performed at [place], [place] [address] [place], [place] [address]    Gram Stain   Final    FEW WBC PRESENT, PREDOMINANTLY PMN FEW GRAM POSITIVE COCCI IN PAIRS RARE GRAM VARIABLE ROD Performed at [facility], [place] [address] [place], [place] [address]    Culture PENDING  Incomplete   Report Status PENDING  Incomplete  MRSA PCR Screening     Status: None   Collection Time: 05/02/17 11:14 PM  Result Value Ref Range Status   MRSA by PCR NEGATIVE NEGATIVE Final    Comment:        The GeneXpert MRSA Assay (FDA approved for NASAL specimens only), is one component of a comprehensive MRSA colonization surveillance program. It is not intended to diagnose MRSA infection nor to guide or monitor treatment for MRSA infections. Performed at [facility], [place] [address], [place], [place] [address]     Fynn Adel W Amani Nodarse, MD [facility] [email or web address] [phone] pager  [phone] cell 05/03/2017, 2:44 PM

## 2017-05-03 NOTE — Procedures (Signed)
Procedure: Right knee aspiration   Indication: Right knee effusion(s)  Surgeon: Silvestre Gunner, PA-C  Assist: None  Anesthesia: None  EBL: None  Complications: None  Findings: After risks/benefits explained patient desires to undergo procedure. The right knee was sterilely prepped and aspirated. 12ml clear yellow fluid obtained. Pt tolerated the procedure well. Improvement in knee flexion from 80 degrees to 65 degrees.    Lisette Abu, PA-C Orthopedic Surgery [phone]

## 2017-05-03 NOTE — Consult Note (Addendum)
[place] Nurse wound consult note Reason for Consult: Consult requested for right leg wound.  Pt has been followed at an outpatient wound care center and they have been using silver hydrofiber packing, the patient states.  She developed increased edema and pain recently. 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 Measurement: 1X1X.8cm Wound bed: inner wound bed is yellow slough Drainage (amount, consistency, odor) mod amt tan drainage, no odor Periwound: Generalized edema and erythremia surrounding the wound and to right knee. Dressing procedure/placement/frequency: Continue present plan of care with Aquacel to absorb drainage and provide antimicrobial benefits. Foam dressing to protect from further injury.  Pt can resume follow-up with the outpatient wound care center after discharge.   CT scan indicates possible fluid collection in the knee and joints.  Please refer to ortho service for further plan of care. Please re-consult if further assistance is needed.  Thank-you,  Julien Girt MSN, [place], [place], [place], [place]

## 2017-05-04 ENCOUNTER — Encounter (HOSPITAL_COMMUNITY)
Admission: EM | Disposition: A | Discharge status: Home Health | Payer: Self-pay | Source: Home / Self Care | Attending: Internal Medicine

## 2017-05-04 LAB — CBC
HCT: 35.2 % — ABNORMAL LOW (ref 36.0–46.0)
Hemoglobin: 10.6 g/dL — ABNORMAL LOW (ref 12.0–15.0)
MCH: 22.6 pg — ABNORMAL LOW (ref 26.0–34.0)
MCHC: 30.1 g/dL (ref 30.0–36.0)
MCV: 75.1 fL — ABNORMAL LOW (ref 78.0–100.0)
PLATELETS: 490 10*3/uL — AB (ref 150–400)
RBC: 4.69 MIL/uL (ref 3.87–5.11)
RDW: 17 % — ABNORMAL HIGH (ref 11.5–15.5)
WBC: 11.4 10*3/uL — AB (ref 4.0–10.5)

## 2017-05-04 LAB — BASIC METABOLIC PANEL
ANION GAP: 9 (ref 5–15)
BUN: 17 mg/dL (ref 6–20)
CALCIUM: 9.2 mg/dL (ref 8.9–10.3)
CO2: 24 mmol/L (ref 22–32)
Chloride: 106 mmol/L (ref 101–111)
Creatinine, Ser: 0.7 mg/dL (ref 0.44–1.00)
GLUCOSE: 111 mg/dL — AB (ref 65–99)
Potassium: 3.9 mmol/L (ref 3.5–5.1)
SODIUM: 139 mmol/L (ref 135–145)

## 2017-05-04 SURGERY — IRRIGATION AND DEBRIDEMENT EXTREMITY
Anesthesia: General | Laterality: Right

## 2017-05-04 NOTE — Progress Notes (Signed)
[place] for Infectious Disease   Reason for visit: Follow up on possible cellulitis  Interval History: synovial fluid with 680 WBCs, no crystals, gram stain with GPC in pairs and gram variable rods but no growth to date.  She is concerned with getting surgery due to anesthesia risk.  Being transferred to [facility] when bed available for consideration of surgery by Dr. Percell Miller.  Vancomycin was stopped   Physical Exam: Constitutional:  Vitals:   05/04/17 0630 05/04/17 1129  BP: (!) 177/98 (!) 168/88  Pulse: 73 86  Resp: 16   Temp: 98.2 F (36.8 C)   SpO2:     patient appears in NAD, up in chair Eyes: anicteric Respiratory: Normal respiratory effort; CTA B Cardiovascular: RRR GI: soft, nt, nd  Review of Systems: Constitutional: negative for fevers and chills Gastrointestinal: negative for diarrhea Integument/breast: negative for rash  Lab Results  Component Value Date   WBC 11.4 (H) 05/04/2017   HGB 10.6 (L) 05/04/2017   HCT 35.2 (L) 05/04/2017   MCV 75.1 (L) 05/04/2017   PLT 490 (H) 05/04/2017    Lab Results  Component Value Date   CREATININE 0.70 05/04/2017   BUN 17 05/04/2017   NA 139 05/04/2017   K 3.9 05/04/2017   CL 106 05/04/2017   CO2 24 05/04/2017    Lab Results  Component Value Date   ALT 13 (L) 05/02/2017   AST 21 05/02/2017   ALKPHOS 64 05/02/2017     Microbiology: Recent Results (from the past 240 hour(s))  Wound or Superficial Culture     Status: None (Preliminary result)   Collection Time: 05/02/17 10:55 PM  Result Value Ref Range Status   Specimen Description   Final    WOUND RIGHT KNEE Performed at [facility], [place] [address] [place], [place] [address]    Special Requests   Final    NONE Performed at [facility], [place] [address], [place], [place] [address]    Gram Stain   Final    FEW WBC PRESENT, PREDOMINANTLY PMN FEW GRAM POSITIVE COCCI IN PAIRS RARE GRAM VARIABLE ROD    Culture   Final     CULTURE REINCUBATED FOR BETTER GROWTH Performed at [facility], [place] [address] [place], [place] [address]    Report Status PENDING  Incomplete  MRSA PCR Screening     Status: None   Collection Time: 05/02/17 11:14 PM  Result Value Ref Range Status   MRSA by PCR NEGATIVE NEGATIVE Final    Comment:        The GeneXpert MRSA Assay (FDA approved for NASAL specimens only), is one component of a comprehensive MRSA colonization surveillance program. It is not intended to diagnose MRSA infection nor to guide or monitor treatment for MRSA infections. Performed at [facility], [place] [address] [place], [place] [address]   Body fluid culture     Status: None (Preliminary result)   Collection Time: 05/03/17  1:30 PM  Result Value Ref Range Status   Specimen Description   Final    SYNOVIAL RIGHT KNEE Performed at [facility], [place] [address], [place], [place] [address]    Special Requests   Final    NONE Performed at [facility], [place] [address] [place], [place] [address]    Gram Stain   Final    FEW WBC PRESENT, PREDOMINANTLY PMN FEW GRAM POSITIVE COCCI IN PAIRS RARE GRAM VARIABLE ROD  Gram Stain Report Called to,Read Back By and Verified With: PELLETIER,E. RN @1459  ON 03.28.19 BY COHEN,K Performed at [facility], [place] [address], [place], [place] [address]    Culture   Final    NO GROWTH 1 DAY Performed at [facility], [place] [address], [place], [place] [address]    Report Status PENDING  Incomplete    Impression/Plan:  1. Cellulitis - no current signs of cellulitis and wound without any pus noted.  Will need to continue wound care.  2.  Knee effusion - low amount of WBCs though small amount of bacteria on gram stain.  WBCs not c/w infection.  Will continue to observe off of antibiotics pending surgery.

## 2017-05-04 NOTE — Progress Notes (Signed)
PROGRESS NOTE    Tamara Howe  EUM:[id number] DOB: Oct 09, 1945 DOA: 05/02/2017 PCP: Tish Frederickson, [facility]    Brief Narrative:  Tamara Howe is a 72 y.o. 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 Patient has had her right lower extremity wound since December of 2018 that she noted after having a joint injection of right knee due to arthritis. She was last admitted to the hospital from 1/1-1/6 for sepsis thought to be secondary to right lower extremity cellulitis with wound cultures positive for MSSA.  Patient was initially treated with vancomycin.  ID was consulted and patient was changed to clindamycin at discharge. She had been going to the wound care center and was being treated with Levaquin for the last 20 days of 30-day course.  The patient's husband had been changing out the dressing as previously advised.  Over the last 24-48 hours patient reports acute redness of the right lower extremity with worsening drainage from the wound.  She had followed up with the wound care center and was sent over for need of IV antibiotics and further imaging as there was concern for osteomyelitis.  CT right tibia/fibula revealed diffuse synovial thickening with moderate joint effusion as seen on the prior CT consistent with an infectious or inflammatory synovitis. Blood cultures were obtained and patient was started on IV antibiotics of vancomycin. ID and orthopedic surgery were consulted. ID did not feel patient had active cellulitis and recommended stopping antibiotics. Orthopedic surgery performed joint aspiration on 3/28.  Gram stain of synovial fluid resulted with few WBC, gram-positive cocci in pairs, gram variable rod. Initially, patient was to undergo I&D with orthopedic surgery on 3/29 afternoon at [facility].  Due to bed  availability and non-urgent nature of the procedure, this was deferred to over the weekend.  Patient will transfer to [facility] for possible surgical procedure in the next day or 2.   Assessment & Plan:   Principal Problem:   Cellulitis of right lower extremity Active Problems:   Essential hypertension   Microcytic hypochromic anemia   Anxiety   Right lower extremity cellulitis / knee joint infection  -Has been ongoing since December 2018.  Due to multiple drug allergies, patient was treated with clindamycin last hospitalization for wound culture positive for MSSA.  She was started on Levaquin as an outpatient and has been on tx for about 3 weeks.  She now returns with worsening erythema, drainage from the wound.  -CT tibia-fibula revealed moderate joint effusion -CRP < 0.8  -Wound RN consulted  -Superficial wound culture of right knee obtained in ED, pending   -Blood cultures pending  -Orthopedic surgery performed bedside joint aspiration 3/28, gram stain positive for few WBC, few gram positive cocci in pairs, rare gram variable rod. Will possibly require surgical I&D in the OR. Spoke with Dr. Percell Miller today. Plan for transfer to [facility] for possible surgical procedure next day or two -ID following, IV vanco stopped. Await culture results   Hypertensive urgency -Continue hydralazine, chlorthalidone, metoprolol, clonidine patch, and nifedipine -BP stable   Anxiety -Continue Klonopin as needed for anxiety  Hyperlipidemia -Continue atorvastatin   DVT prophylaxis: Lovenox Code Status: Full Family Communication: At bedside Disposition Plan: Transfer to [facility] for possible surgical procedure this weekend    Consultants:   ID  Orthopedic surgery   Procedures:  Bedside joint aspiration right knee 3/28   Antimicrobials:  Anti-infectives (From admission, onward)   Start     Dose/Rate Route Frequency Ordered Stop   05/04/17 0000   vancomycin (VANCOCIN) 1,250 mg in sodium chloride 0.9 % 250 mL IVPB  Status:  Discontinued     1,250 mg 166.7 mL/hr over 90 Minutes Intravenous Every 36 hours 05/03/17 0525 05/04/17 0742   05/02/17 2330  vancomycin (VANCOCIN) IVPB 1000 mg/200 mL premix     1,000 mg 200 mL/hr over 60 Minutes Intravenous  Once 05/02/17 2323 05/03/17 0423       Subjective: No new complaints.  No new events overnight.  Objective: Vitals:   05/03/17 2021 05/04/17 0214 05/04/17 0630 05/04/17 1129  BP: (!) 125/59  (!) 177/98 (!) 168/88  Pulse: 61  73 86  Resp: 16  16   Temp: (!) 97.5 F (36.4 C)  98.2 F (36.8 C)   TempSrc: Oral  Oral   SpO2: 97%     Weight:  59.3 kg (130 lb 11.7 oz)    Height:        Intake/Output Summary (Last 24 hours) at 05/04/2017 1258 Last data filed at 05/04/2017 0630 Gross per 24 hour  Intake 970 ml  Output 1600 ml  Net -630 ml   Filed Weights   05/03/17 0000 05/03/17 0321 05/04/17 0214  Weight: 55.8 kg (123 lb) 60 kg (132 lb 4.4 oz) 59.3 kg (130 lb 11.7 oz)    Examination: General exam: Appears calm and comfortable  Respiratory system: Clear to auscultation. Respiratory effort normal. Cardiovascular system: S1 & S2 heard, RRR. No JVD, murmurs, rubs, gallops or clicks. No pedal edema. Gastrointestinal system: Abdomen is nondistended, soft and nontender. No organomegaly or masses felt. Normal bowel sounds heard. Central nervous system: Alert and oriented. No focal neurological deficits. Extremities: Right knee with joint effusion laterally but decreased in size from yesterday's exam  Skin: +packing present in wound distal to right knee  Psychiatry: Judgement and insight appear normal. Mood & affect appropriate.    Data Reviewed: I have personally reviewed following labs and imaging studies  CBC: Recent Labs  Lab 05/02/17 1954 05/03/17 0522 05/04/17 0506  WBC 10.3 12.3* 11.4*  NEUTROABS 5.8  --   --   HGB 9.8* 10.9* 10.6*  HCT 32.1* 36.0 35.2*  MCV 76.2*  75.9* 75.1*  PLT 420* 482* 811*   Basic Metabolic Panel: Recent Labs  Lab 05/02/17 1954 05/03/17 0522 05/04/17 0506  NA 141 140 139  K 4.0 3.5 3.9  CL 108 108 106  CO2 25 22 24   GLUCOSE 102* 115* 111*  BUN 22* 17 17  CREATININE 0.78 0.70 0.70  CALCIUM 9.3 9.2 9.2   GFR: Estimated Creatinine Clearance: 53.4 mL/min (by C-G formula based on SCr of 0.7 mg/dL). Liver Function Tests: Recent Labs  Lab 05/02/17 1954  AST 21  ALT 13*  ALKPHOS 64  BILITOT 0.2*  PROT 6.8  ALBUMIN 3.6   No results for input(s): LIPASE, AMYLASE in the last 168 hours. No results for input(s): AMMONIA in the last 168 hours. Coagulation Profile: No results for input(s): INR, PROTIME in the last 168 hours. Cardiac Enzymes: No results for input(s): CKTOTAL, CKMB, CKMBINDEX, TROPONINI in the last 168 hours. BNP (last 3 results) No results for input(s): PROBNP in the last 8760 hours. HbA1C: No results for input(s): HGBA1C in the last 72 hours. CBG: No results for input(s): GLUCAP in the last 168 hours. Lipid Profile: No results  for input(s): CHOL, HDL, LDLCALC, TRIG, CHOLHDL, LDLDIRECT in the last 72 hours. Thyroid Function Tests: No results for input(s): TSH, T4TOTAL, FREET4, T3FREE, THYROIDAB in the last 72 hours. Anemia Panel: No results for input(s): VITAMINB12, FOLATE, FERRITIN, TIBC, IRON, RETICCTPCT in the last 72 hours. Sepsis Labs: No results for input(s): PROCALCITON, LATICACIDVEN in the last 168 hours.  Recent Results (from the past 240 hour(s))  Wound or Superficial Culture     Status: None (Preliminary result)   Collection Time: 05/02/17 10:55 PM  Result Value Ref Range Status   Specimen Description   Final    WOUND RIGHT KNEE Performed at [place] [address], [place], [place] [address]    Special Requests   Final    NONE Performed at [facility], [place] [address], [place], [place] [address]    Gram Stain   Final    FEW WBC  PRESENT, PREDOMINANTLY PMN FEW GRAM POSITIVE COCCI IN PAIRS RARE GRAM VARIABLE ROD    Culture   Final    CULTURE REINCUBATED FOR BETTER GROWTH Performed at [facility], [place] [address], [place], [place] [address]    Report Status PENDING  Incomplete  MRSA PCR Screening     Status: None   Collection Time: 05/02/17 11:14 PM  Result Value Ref Range Status   MRSA by PCR NEGATIVE NEGATIVE Final    Comment:        The GeneXpert MRSA Assay (FDA approved for NASAL specimens only), is one component of a comprehensive MRSA colonization surveillance program. It is not intended to diagnose MRSA infection nor to guide or monitor treatment for MRSA infections. Performed at [facility], [place] [address], [place], [place] [address]   Body fluid culture     Status: None (Preliminary result)   Collection Time: 05/03/17  1:30 PM  Result Value Ref Range Status   Specimen Description   Final    SYNOVIAL RIGHT KNEE Performed at [facility], [place] [address] [place], [place] [address]    Special Requests   Final    NONE Performed at [facility], [place] [address] [place], [place] [address]    Gram Stain   Final    FEW WBC PRESENT, PREDOMINANTLY PMN FEW GRAM POSITIVE COCCI IN PAIRS RARE GRAM VARIABLE ROD Gram Stain Report Called to,Read Back By and Verified With: PELLETIER,E. RN @1459  ON 03.28.19 BY COHEN,K Performed at [facility], [place] [address], [place], [place] [address]    Culture   Final    NO GROWTH 1 DAY Performed at [facility], [place] [address], [place],  [address]    Report Status PENDING  Incomplete       Radiology Studies: Dg Tibia/fibula Right  Result Date: 05/02/2017 CLINICAL DATA:  Knee and lower leg swelling and erythema. Likely infection. EXAM: RIGHT TIBIA AND FIBULA - 2 VIEW COMPARISON:  None. FINDINGS: Soft tissue swelling is evident about the knee. Healed proximal fibular fracture is  noted. Mild osteopenia is present. No other focal soft tissue abnormalities are present in the lower leg. The ankle joint is located. IMPRESSION: Proximal soft tissue swelling about the knee. Please see knee radiograph report of the same day. The more distal tibia and fibula demonstrate no acute bone or soft tissue abnormalities. Electronically Signed   By: San Morelle M.D.   On: 05/02/2017 20:33   Ct Knee Right Wo Contrast  Result Date: 05/03/2017  CLINICAL DATA:  72 year old female with persistent wound inferior to the right knee and poor wound healing. Concern for osteomyelitis. EXAM: CT OF THE right KNEE WITHOUT CONTRAST TECHNIQUE: Multidetector CT imaging of the right knee was performed according to the standard protocol. Multiplanar CT image reconstructions were also generated. COMPARISON:  Right knee radiograph dated 05/02/2017 and CT dated 02/07/2017 FINDINGS: Evaluation of this exam is limited in the absence of intravenous contrast. Bones/Joint/Cartilage There is no acute fracture or dislocation. Old healed fracture of the proximal third of the fibular diaphysis. No periosteal elevation. Severe arthritic changes of the knee with sclerotic changes involving the lateral compartment along the lateral tibial plateau and femoral condyle. There is valgus tilt of the knee as seen on the prior CT. There is multiple osteophytes and bone spur. There is diffuse thickening of the synovium and knee capsule with moderate amount of joint effusion which is concerning for an infectious process, possibly chronic or an inflammatory synovitis. Correlation with clinical exam and joint fluid analysis recommended. There is thickening of the soft tissues inferior to the medial knee with a focal area of skin defect consistent with known ulcer/wound. Ligaments Suboptimally assessed by CT. Muscles and Tendons No intramuscular gas or fluid collection. Soft tissues Diffuse subcutaneous edema and subcutaneous soft tissue  thickening and skin ulcer inferior to the medial knee. IMPRESSION: 1. Diffuse synovial thickening with moderate joint effusion as seen on the prior CT consistent with an infectious or inflammatory synovitis. Correlation with clinical exam and joint fluid analysis recommended. 2. Severe arthropathy of the knee with valgus tilt similar to prior CT. 3. Skin wound inferior to the medial knee. 4. No acute fracture or dislocation.  Osteopenia. Electronically Signed   By: Anner Crete M.D.   On: 05/03/2017 01:08   Ct Tibia Fibula Right Wo Contrast  Result Date: 05/03/2017 CLINICAL DATA:  73 year old female with persistent wound inferior to the right knee and poor wound healing. Concern for osteomyelitis. EXAM: CT OF THE right KNEE WITHOUT CONTRAST TECHNIQUE: Multidetector CT imaging of the right knee was performed according to the standard protocol. Multiplanar CT image reconstructions were also generated. COMPARISON:  Right knee radiograph dated 05/02/2017 and CT dated 02/07/2017 FINDINGS: Evaluation of this exam is limited in the absence of intravenous contrast. Bones/Joint/Cartilage There is no acute fracture or dislocation. Old healed fracture of the proximal third of the fibular diaphysis. No periosteal elevation. Severe arthritic changes of the knee with sclerotic changes involving the lateral compartment along the lateral tibial plateau and femoral condyle. There is valgus tilt of the knee as seen on the prior CT. There is multiple osteophytes and bone spur. There is diffuse thickening of the synovium and knee capsule with moderate amount of joint effusion which is concerning for an infectious process, possibly chronic or an inflammatory synovitis. Correlation with clinical exam and joint fluid analysis recommended. There is thickening of the soft tissues inferior to the medial knee with a focal area of skin defect consistent with known ulcer/wound. Ligaments Suboptimally assessed by CT. Muscles and Tendons  No intramuscular gas or fluid collection. Soft tissues Diffuse subcutaneous edema and subcutaneous soft tissue thickening and skin ulcer inferior to the medial knee. IMPRESSION: 1. Diffuse synovial thickening with moderate joint effusion as seen on the prior CT consistent with an infectious or inflammatory synovitis. Correlation with clinical exam and joint fluid analysis recommended. 2. Severe arthropathy of the knee with valgus tilt similar to prior CT. 3. Skin wound inferior to the medial knee.  4. No acute fracture or dislocation.  Osteopenia. Electronically Signed   By: Anner Crete M.D.   On: 05/03/2017 01:08   Dg Knee Complete 4 Views Right  Result Date: 05/02/2017 CLINICAL DATA:  Knee and lower leg swelling and erythema. EXAM: RIGHT KNEE - COMPLETE 4+ VIEW COMPARISON:  Right knee radiographs 03/28/2017. FINDINGS: Lateral tibial plateau collapse and sclerosis of the lateral compartment of the knee is again seen. Changes of the lateral femoral condyle demonstrate some progression. There is a dressing or drain at the site of the previously noted ulcer. A moderate-sized joint effusion remains. No osseous destruction is evident. Proximal fibular remote fracture is noted. IMPRESSION: 1. Progressive sclerotic changes of the lateral femoral condyle. 2. Stable changes of the lateral tibial plateau. 3. No focal osseous destruction. 4. Persistent moderate effusion. 5. Drain or dressing in the anterior medial soft tissues of the lower leg. Electronically Signed   By: San Morelle M.D.   On: 05/02/2017 20:32      Scheduled Meds: . atorvastatin  40 mg Oral Daily  . chlorthalidone  25 mg Oral Daily  . [START ON 05/09/2017] cloNIDine  0.2 mg Transdermal Weekly  . divalproex  500 mg Oral Daily  . enoxaparin (LOVENOX) injection  40 mg Subcutaneous QHS  . feeding supplement (ENSURE ENLIVE)  237 mL Oral BID BM  . hydrALAZINE  75 mg Oral TID  . metoprolol tartrate  50 mg Oral BID  . multivitamin with  minerals  1 tablet Oral Daily  . NIFEdipine  60 mg Oral Daily   Continuous Infusions:    LOS: 1 day    Time spent: 40 minutes   Dessa Phi, DO [facility] [email or web address] Password [facility] 05/04/2017, 12:58 PM

## 2017-05-04 NOTE — Care Management Note (Signed)
Case Management Note  Patient Details  Name: Tamara Howe MRN: [id number] Date of Birth: 28-Dec-1945  Subjective/Objective:  Noted for transfer to [facility] for I&D per ortho.PT recc HHC. Will await outcome of procedure, & continue to follow for d/c needs.                Action/Plan:d/c home w/HHC   Expected Discharge Date:                  Expected Discharge Plan:  [place]  In-House Referral:     Discharge planning Services  CM Consult  Post Acute Care Choice:    Choice offered to:     DME Arranged:    DME Agency:     HH Arranged:    [place] Agency:     Status of Service:  In process, will continue to follow  If discussed at Long Length of Stay Meetings, dates discussed:    Additional Comments:  Dessa Phi, RN 05/04/2017, 2:42 PM

## 2017-05-04 NOTE — Evaluation (Signed)
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 Patient has had her right lower extremity wound since December of 2018 that she noted after having a joint injection of right knee due to arthritis. Pt with previous admission 1/1-1/6 for sepsis thought to be secondary to right lower extremity cellulitis with wound cultures positive for MSSA.  Right knee aspirated 05/03/17  Clinical Impression  Patient evaluated by Physical Therapy with no further acute PT needs identified. All education has been completed and the patient has no further questions.  Pt able to mobilize around her room with RW and reports this is her mobility at baseline (for the past few months, since R LE wound began).  Pt would like HHPT upon d/c.  See below for any follow-up Physical Therapy or equipment needs. PT is signing off. Thank you for this referral.     Follow Up Recommendations Home health PT    Equipment Recommendations  None recommended by PT    Recommendations for Other Services       Precautions / Restrictions Precautions Precautions: None      Mobility  Bed Mobility Overal bed mobility: Modified Independent             General bed mobility comments: pt up in her room on arrival with RW  Transfers Overall transfer level: Needs assistance Equipment used: Rolling walker (2 wheeled) Transfers: Sit to/from Stand Sit to Stand: Min guard         General transfer comment: min/guard for safety, utilizes armrests  Ambulation/Gait Ambulation/Gait assistance: Min guard Ambulation Distance (Feet): 8 Feet Assistive device: Rolling walker (2 wheeled) Gait Pattern/deviations:  Step-to pattern;Decreased stance time - right;Antalgic     General Gait Details: min/guard for safety due to antalgic gait, pt reports 3/10 pain, appears steady with RW, limited to room distance as pt does household distances only at baseline.  Stairs            Wheelchair Mobility    Modified Rankin (Stroke Patients Only)       Balance Overall balance assessment: Needs assistance         Standing balance support: Bilateral upper extremity supported Standing balance-Leahy Scale: Poor Standing balance comment: utilizes RW for UE support, also for pain control                             Pertinent Vitals/Pain Pain Assessment: 0-10 Pain Score: 3  Pain Location: R knee area Pain Descriptors / Indicators: Discomfort Pain Intervention(s): Monitored during session;Limited activity within patient's tolerance;Repositioned    Home Living Family/patient expects to be discharged to:: Private residence Living Arrangements: Spouse/significant other Available Help at Discharge: Family;Available 24 hours/day Type of Home: House Home Access: Level entry     Home Layout: One level Home Equipment: Walker - 2 wheels;Walker - 4 wheels;Cane - single point;Bedside commode;Wheelchair - Press photographer      Prior Function Level of Independence: Needs assistance   Gait / Transfers Assistance Needed: has RW and cane for home distances, uses w/c for longer distances  ADL's / Homemaking Assistance Needed: spouse assist with bathing/dressing        Hand Dominance  Extremity/Trunk Assessment        Lower Extremity Assessment Lower Extremity Assessment: RLE deficits/detail RLE Deficits / Details: knee with severe valgus deformity, wound with dressing promixal, anterior, medial lower leg, distal redness however pt reports improved since admission, "not so angry looking now"; pt able to perform knee flexion observed approximately 80* actively, lacking a  little knee extension and pt reports she was not able to bend her knee prior to admission       Communication   Communication: No difficulties  Cognition Arousal/Alertness: Awake/alert Behavior During Therapy: WFL for tasks assessed/performed Overall Cognitive Status: Within Functional Limits for tasks assessed                                 General Comments: slightly agitated on arrival and pt verbalizing "I will not have surgery" however pt redirected and able to calm with further discussion      General Comments      Exercises     Assessment/Plan    PT Assessment Patient needs continued PT services  PT Problem List Decreased activity tolerance;Decreased strength;Decreased mobility       PT Treatment Interventions      PT Goals (Current goals can be found in the Care Plan section)  Acute Rehab PT Goals PT Goal Formulation: All assessment and education complete, DC therapy    Frequency     Barriers to discharge        Co-evaluation               AM-PAC PT "6 Clicks" Daily Activity  Outcome Measure Difficulty turning over in bed (including adjusting bedclothes, sheets and blankets)?: None Difficulty moving from lying on back to sitting on the side of the bed? : None Difficulty sitting down on and standing up from a chair with arms (e.g., wheelchair, bedside commode, etc,.)?: A Little Help needed moving to and from a bed to chair (including a wheelchair)?: A Little Help needed walking in hospital room?: A Little Help needed climbing 3-5 steps with a railing? : Total 6 Click Score: 18    End of Session   Activity Tolerance: Patient tolerated treatment well Patient left: in chair;with call bell/phone within reach;with family/visitor present Nurse Communication: Mobility status      Time: 6644-0347 PT Time Calculation (min) (ACUTE ONLY): 20 min   Charges:   PT Evaluation $PT Eval Low Complexity: 1 Low     PT G CodesCarmelia Bake, PT, DPT 05/04/2017 Pager: [phone]  York Ram E 05/04/2017, 1:18 PM

## 2017-05-04 NOTE — Plan of Care (Addendum)
In rounds patient informed of possible transfer to [facility] to have Ortho  I&D completed on Knee.  Patient has great concerns with going to the OR d/t prior hx with anesthesia and other health hx.  Dr. Silvestre Gunner contacted to confirm surgery, but stated Dr. Percell Miller @ [facility] has decided to wait on procedure till possibly tomorrow.  Staff will continue to assess patient. Patient/family informed of change in POC and diet re-started per verbal orders.  Dr. Maylene Roes discussed with Dr. Percell Miller and both agreed to continue with transfer orders for today or when a tele bed becomes available at [facility].  That way, patient will be at [facility] if they need to go to the OR for I&D over the weekend.  Dr. Dellis Filbert informed of patient concerns with going under anesthesia. He was clear - they would address the patient's concerns and ask for consent, once at [facility] and if the procedure is deemed to be necessary.    Patient informed and agreed to transfer to [facility].

## 2017-05-05 DIAGNOSIS — L03115 Cellulitis of right lower limb: Principal | ICD-10-CM

## 2017-05-05 LAB — CBC
HCT: 31.3 % — ABNORMAL LOW (ref 36.0–46.0)
Hemoglobin: 9.4 g/dL — ABNORMAL LOW (ref 12.0–15.0)
MCH: 22.5 pg — AB (ref 26.0–34.0)
MCHC: 30 g/dL (ref 30.0–36.0)
MCV: 74.9 fL — ABNORMAL LOW (ref 78.0–100.0)
Platelets: 388 10*3/uL (ref 150–400)
RBC: 4.18 MIL/uL (ref 3.87–5.11)
RDW: 17.3 % — AB (ref 11.5–15.5)
WBC: 9.8 10*3/uL (ref 4.0–10.5)

## 2017-05-05 LAB — AEROBIC CULTURE  (SUPERFICIAL SPECIMEN)

## 2017-05-05 LAB — AEROBIC CULTURE W GRAM STAIN (SUPERFICIAL SPECIMEN)

## 2017-05-05 NOTE — Progress Notes (Addendum)
Patient ID: Tamara Howe, female   DOB: 1945-05-31, 72 y.o.   MRN: [id number]          [facility] Infectious Disease    Date of Admission:  05/02/2017     Tamara Howe has a right knee effusion that was tapped 2 days ago.  The fluid was described as clear yellow and only had 680 white blood cells with 49% segmented neutrophils.  Initial Gram stain was read as showing gram-positive cocci in pairs and gram variable rods.  I had that Gram stain reread and a new Gram stain made.  Our micro tech here did not see any organisms on either slide.  The culture is negative.  Her sed rate and C-reactive protein are normal.  There is no evidence of septic arthritis.  There is no need to restart antibiotics.  We will sign off now.         Michel Bickers, MD [facility] [phone] pager   [phone] cell 05/05/2017, 2:20 PM

## 2017-05-05 NOTE — Progress Notes (Signed)
Patient ID: Tamara Howe, female   DOB: Aug 23, 1945, 72 y.o.   MRN: [id number]  PROGRESS NOTE    Karrissa Parchment  WNI:[id number] DOB: 12/21/1945 DOA: 05/02/2017 PCP: Tish Frederickson, [facility]   Outpatient Specialists:   Brief Narrative: Tenecia Ignasiak is a 72 y.o. female with medical history significant of HTN, chronic right lower extremity wound, cellulitis, chronic pain, anemia; who presents with worsening wound and erythema of the right lower extremity. Patient has had his right lower extremity wound since December of 2018 that she noted after having a joint injection.  Last admitted to the hospital from 1/1-1/6 for sepsis thought to be secondary to right lower extremity cellulitis with wound cultures positive for MSSA. Patient was transferred from [facility] to [facility] for possible orthopedic surgery.  Seen by infectious disease with initial Gram stains and culture showing no organisms.    Assessment & Plan:   Principal Problem:   Cellulitis of right lower extremity Active Problems:   Essential hypertension   Microcytic hypochromic anemia   Anxiety   #1 cellulitis and abscess of the right lower extremity: no sign of infection at the moment.  Antibiotics have been discontinued by infectious disease.  Patient be monitored.  Defer to orthopedic surgery for next line of action Continue wound care.  #2 essential hypertension: Blood pressure appears controlled.  She had hypertensive urgency on admission. Continue treatment  #3 anxiety disorder: continue home regimen  #4 GERD: Continue home regimen  #5 hyperlipidemia: continue statin  #6 microcytic anemia: No evidence of GI bleed.  Probably due to chronic disease.   DVT prophylaxis: Lovenox  Code Status: Full  Family Communication: Husband at bedside  Disposition Plan: To be determined   Consultants:   Orthopedics, Dr Orion Crook  Dr Michel Bickers, Infectious Disease  Procedures: I & D and aspiration of the  Joint  Antimicrobials: None  Subjective: Patient is still complaining of pain and swelling of the right leg around the knee.  Area is dressed.  Objective: Vitals:   05/04/17 2336 05/05/17 0504 05/05/17 0911 05/05/17 1253  BP: (!) 145/66 (!) 147/87 (!) 147/87 (!) 157/81  Pulse:  70 70 61  Resp:    18  Temp: 98.6 F (37 C) 98.1 F (36.7 C)  98.2 F (36.8 C)  TempSrc: Oral Oral  Oral  SpO2:  97%  99%  Weight:      Height:        Intake/Output Summary (Last 24 hours) at 05/05/2017 1448 Last data filed at 05/04/2017 1608 Gross per 24 hour  Intake -  Output 200 ml  Net -200 ml   Filed Weights   05/03/17 0000 05/03/17 0321 05/04/17 0214  Weight: 55.8 kg (123 lb) 60 kg (132 lb 4.4 oz) 59.3 kg (130 lb 11.7 oz)    Examination:  General exam: Appears calm and comfortable  Respiratory system: Clear to auscultation. Respiratory effort normal. Cardiovascular system: S1 & S2 heard, RRR. No JVD, murmurs, rubs, gallops or clicks. No pedal edema. Gastrointestinal system: Abdomen is nondistended, soft and nontender. No organomegaly or masses felt. Normal bowel sounds heard. Central nervous system: Alert and oriented. No focal neurological deficits. Extremities: Symmetric 5 x 5 power. Right lower extremity ulcer just below the knee, dressed, red and warm Skin: Redness of area below the right knee Psychiatry: Judgement and insight appear normal. Mood & affect appropriate.     Data Reviewed: I have personally reviewed following labs and imaging studies  CBC: Recent Labs  Lab 05/02/17  1954 05/03/17 0522 05/04/17 0506 05/05/17 0704  WBC 10.3 12.3* 11.4* 9.8  NEUTROABS 5.8  --   --   --   HGB 9.8* 10.9* 10.6* 9.4*  HCT 32.1* 36.0 35.2* 31.3*  MCV 76.2* 75.9* 75.1* 74.9*  PLT 420* 482* 490* 619   Basic Metabolic Panel: Recent Labs  Lab 05/02/17 1954 05/03/17 0522 05/04/17 0506  NA 141 140 139  K 4.0 3.5 3.9  CL 108 108 106  CO2 25 22 24   GLUCOSE 102* 115* 111*  BUN 22*  17 17  CREATININE 0.78 0.70 0.70  CALCIUM 9.3 9.2 9.2   GFR: Estimated Creatinine Clearance: 53.4 mL/min (by C-G formula based on SCr of 0.7 mg/dL). Liver Function Tests: Recent Labs  Lab 05/02/17 1954  AST 21  ALT 13*  ALKPHOS 64  BILITOT 0.2*  PROT 6.8  ALBUMIN 3.6   No results for input(s): LIPASE, AMYLASE in the last 168 hours. No results for input(s): AMMONIA in the last 168 hours. Coagulation Profile: No results for input(s): INR, PROTIME in the last 168 hours. Cardiac Enzymes: No results for input(s): CKTOTAL, CKMB, CKMBINDEX, TROPONINI in the last 168 hours. BNP (last 3 results) No results for input(s): PROBNP in the last 8760 hours. HbA1C: No results for input(s): HGBA1C in the last 72 hours. CBG: No results for input(s): GLUCAP in the last 168 hours. Lipid Profile: No results for input(s): CHOL, HDL, LDLCALC, TRIG, CHOLHDL, LDLDIRECT in the last 72 hours. Thyroid Function Tests: No results for input(s): TSH, T4TOTAL, FREET4, T3FREE, THYROIDAB in the last 72 hours. Anemia Panel: No results for input(s): VITAMINB12, FOLATE, FERRITIN, TIBC, IRON, RETICCTPCT in the last 72 hours. Urine analysis: No results found for: COLORURINE, APPEARANCEUR, LABSPEC, PHURINE, GLUCOSEU, HGBUR, BILIRUBINUR, KETONESUR, PROTEINUR, UROBILINOGEN, NITRITE, LEUKOCYTESUR Sepsis Labs: @LABRCNTIP (procalcitonin:4,lacticidven:4)  ) Recent Results (from the past 240 hour(s))  Culture, blood (routine x 2)     Status: None (Preliminary result)   Collection Time: 05/02/17  7:54 PM  Result Value Ref Range Status   Specimen Description   Final    BLOOD RIGHT HAND Performed at [facility] [address], [place], [place] [address]    Special Requests   Final    BOTTLES DRAWN AEROBIC AND ANAEROBIC Blood Culture adequate volume Performed at [facility] [address] [place], [place] [address]    Culture   Final    NO GROWTH 2 DAYS Performed at [facility] [address], [place], [place] [address]    Report Status PENDING  Incomplete  Wound or Superficial Culture     Status: None   Collection Time: 05/02/17 10:55 PM  Result Value Ref Range Status   Specimen Description   Final    WOUND RIGHT KNEE Performed at [facility] [address] [place], [place] [address]    Special Requests   Final    NONE Performed at [facility], [place] [address], [place], [place] [address]    Gram Stain   Final    FEW WBC PRESENT, PREDOMINANTLY PMN FEW GRAM POSITIVE COCCI IN PAIRS RARE GRAM VARIABLE ROD Performed at [facility], [place] [address] [place], [place] [address]    Culture   Final    FEW DIPHTHEROIDS(CORYNEBACTERIUM SPECIES) Standardized susceptibility testing for this organism is not available. RARE VIRIDANS STREPTOCOCCUS    Report Status 05/05/2017 FINAL  Final  MRSA PCR Screening     Status: None  Collection Time: 05/02/17 11:14 PM  Result Value Ref Range Status   MRSA by PCR NEGATIVE NEGATIVE Final    Comment:        The GeneXpert MRSA Assay (FDA approved for NASAL specimens only), is one component of a comprehensive MRSA colonization surveillance program. It is not intended to diagnose MRSA infection nor to guide or monitor treatment for MRSA infections. Performed at [facility], [place] [address] [place], [place] [address]   Culture, blood (routine x 2)     Status: None (Preliminary result)   Collection Time: 05/03/17  5:22 AM  Result Value Ref Range Status   Specimen Description   Final    BLOOD RIGHT ANTECUBITAL Performed at [facility] [address] [place], [place] [address]    Special Requests   Final    BOTTLES DRAWN AEROBIC ONLY Blood Culture adequate volume Performed at [facility] [address] [place], [place] [address]    Culture   Final    NO GROWTH 2 DAYS Performed at [facility] [address], [place], [place] [address]    Report Status PENDING  Incomplete  Body fluid culture     Status: None (Preliminary result)   Collection Time: 05/03/17  1:30 PM  Result Value Ref Range Status   Specimen Description   Final    SYNOVIAL RIGHT KNEE Performed at [facility], [place] [address], [place], [place] [address]    Special Requests   Final    NONE Performed at [facility], [place] [address], [place], [place] [address]    Gram Stain   Final    FEW WBC PRESENT, PREDOMINANTLY PMN FEW GRAM POSITIVE COCCI IN PAIRS RARE GRAM VARIABLE ROD Gram Stain Report Called to,Read Back By and Verified With: PELLETIER,E. RN @1459  ON 03.28.19 BY COHEN,K Performed at [facility], [place] [address] [place], [place] [address]    Culture   Final    NO GROWTH 2 DAYS Performed at [facility] [address], [place], [place] [address]    Report Status PENDING  Incomplete         Radiology Studies: No results found.      Scheduled Meds: . atorvastatin  40 mg Oral Daily  . chlorthalidone  25 mg Oral Daily  . [START ON 05/09/2017] cloNIDine  0.2 mg Transdermal Weekly  . divalproex  500 mg Oral Daily  . enoxaparin (LOVENOX) injection  40 mg Subcutaneous QHS  . feeding supplement (ENSURE ENLIVE)  237 mL Oral BID BM  . hydrALAZINE  75 mg Oral TID  . metoprolol tartrate  50 mg Oral BID  . multivitamin with minerals  1 tablet Oral Daily  . NIFEdipine  60 mg Oral Daily   Continuous Infusions:   LOS: 2 days    Time spent: 36 minutes    GARBA,LAWAL, MD [facility] Pager [phone]  If 7PM-7AM, please contact night-coverage [email or web address] Password [email or web address] 05/05/2017, 2:48 PM

## 2017-05-06 LAB — BASIC METABOLIC PANEL
ANION GAP: 10 (ref 5–15)
BUN: 29 mg/dL — AB (ref 6–20)
CALCIUM: 9.1 mg/dL (ref 8.9–10.3)
CO2: 23 mmol/L (ref 22–32)
Chloride: 105 mmol/L (ref 101–111)
Creatinine, Ser: 1.05 mg/dL — ABNORMAL HIGH (ref 0.44–1.00)
GFR calc Af Amer: >60 mL/min (ref >60)
GFR, EST NON AFRICAN AMERICAN: 52 mL/min — AB (ref >60)
GLUCOSE: 98 mg/dL (ref 65–99)
POTASSIUM: 3.9 mmol/L (ref 3.5–5.1)
SODIUM: 138 mmol/L (ref 135–145)

## 2017-05-06 NOTE — Progress Notes (Signed)
Patient ID: Tamara Howe, female   DOB: 1945-12-08, 72 y.o.   MRN: [id number]  PROGRESS NOTE    Britny Riel  BJS:[id number] DOB: Feb 18, 1945 DOA: 05/02/2017 PCP: Tish Frederickson, [facility]   Outpatient Specialists:   Brief Narrative: Ayani Ospina is a 72 y.o. female with medical history significant of HTN, chronic right lower extremity wound, cellulitis, chronic pain, anemia; who presents with worsening wound and erythema of the right lower extremity. Patient has had his right lower extremity wound since December of 2018 that she noted after having a joint injection.  Last admitted to the hospital from 1/1-1/6 for sepsis thought to be secondary to right lower extremity cellulitis with wound cultures positive for MSSA. Patient was transferred from [place] [facility] to [facility] for possible orthopedic surgery.  Seen by infectious disease with initial Gram stains and culture showing no organisms.    Assessment & Plan:   Principal Problem:   Cellulitis of right lower extremity Active Problems:   Essential hypertension   Microcytic hypochromic anemia   Anxiety   #1 cellulitis and abscess of the right lower extremity: Much improved, less redness.  Antibiotics have been discontinued by infectious disease.  Patient be monitored.  Defer to orthopedic surgery and ID for next line of action Continue wound care.  #2 essential hypertension: Blood pressure appears controlled.  She had hypertensive urgency on admission. Now responding to treatment. Dietary counselling given.Continue treatment  #3 anxiety disorder: continue home regimen  #4 GERD: Continue home regimen  #5 hyperlipidemia: continue statin  #6 microcytic anemia: No evidence of GI bleed.  Probably due to chronic disease.   DVT prophylaxis: Lovenox  Code Status: Full  Family Communication: Husband at bedside  Disposition Plan: To be determined   Consultants:   Orthopedics, Dr Orion Crook  Dr Michel Bickers, Infectious  Disease  Procedures: I & D and aspiration of the Joint  Antimicrobials: None  Subjective: Patient is still complaining of pain and swelling of the right leg around the knee.  Has been able to walk in the room however.  Objective: Vitals:   05/05/17 1600 05/05/17 2136 05/06/17 0700 05/06/17 0913  BP: (!) 157/81 (!) 158/74 (!) 155/72 (!) 148/80  Pulse:  71 65 74  Resp:      Temp:  98.1 F (36.7 C) 98.2 F (36.8 C)   TempSrc:  Oral Oral   SpO2:  98% 96%   Weight:      Height:       No intake or output data in the 24 hours ending 05/06/17 1423 Filed Weights   05/03/17 0000 05/03/17 0321 05/04/17 0214  Weight: 55.8 kg (123 lb) 60 kg (132 lb 4.4 oz) 59.3 kg (130 lb 11.7 oz)    Examination:  General exam: Appears calm and comfortable  Respiratory system: Clear to auscultation. Respiratory effort normal. Cardiovascular system: S1 & S2 heard, RRR. No JVD, murmurs, rubs, gallops or clicks. No pedal edema. Gastrointestinal system: Abdomen is nondistended, soft and nontender. No organomegaly or masses felt. Normal bowel sounds heard. Central nervous system: Alert and oriented. No focal neurological deficits. Extremities: Symmetric 5 x 5 power. Right lower extremity ulcer just below the knee, dressed, red and warm Skin: Redness of area below the right knee Psychiatry: Judgement and insight appear normal. Mood & affect appropriate.     Data Reviewed: I have personally reviewed following labs and imaging studies  CBC: Recent Labs  Lab 05/02/17 1954 05/03/17 0522 05/04/17 0506 05/05/17 0704  WBC 10.3 12.3* 11.4*  9.8  NEUTROABS 5.8  --   --   --   HGB 9.8* 10.9* 10.6* 9.4*  HCT 32.1* 36.0 35.2* 31.3*  MCV 76.2* 75.9* 75.1* 74.9*  PLT 420* 482* 490* 062   Basic Metabolic Panel: Recent Labs  Lab 05/02/17 1954 05/03/17 0522 05/04/17 0506 05/06/17 0455  NA 141 140 139 138  K 4.0 3.5 3.9 3.9  CL 108 108 106 105  CO2 25 22 24 23   GLUCOSE 102* 115* 111* 98  BUN 22* 17 17  29*  CREATININE 0.78 0.70 0.70 1.05*  CALCIUM 9.3 9.2 9.2 9.1   GFR: Estimated Creatinine Clearance: 40.7 mL/min (A) (by C-G formula based on SCr of 1.05 mg/dL (H)). Liver Function Tests: Recent Labs  Lab 05/02/17 1954  AST 21  ALT 13*  ALKPHOS 64  BILITOT 0.2*  PROT 6.8  ALBUMIN 3.6   No results for input(s): LIPASE, AMYLASE in the last 168 hours. No results for input(s): AMMONIA in the last 168 hours. Coagulation Profile: No results for input(s): INR, PROTIME in the last 168 hours. Cardiac Enzymes: No results for input(s): CKTOTAL, CKMB, CKMBINDEX, TROPONINI in the last 168 hours. BNP (last 3 results) No results for input(s): PROBNP in the last 8760 hours. HbA1C: No results for input(s): HGBA1C in the last 72 hours. CBG: No results for input(s): GLUCAP in the last 168 hours. Lipid Profile: No results for input(s): CHOL, HDL, LDLCALC, TRIG, CHOLHDL, LDLDIRECT in the last 72 hours. Thyroid Function Tests: No results for input(s): TSH, T4TOTAL, FREET4, T3FREE, THYROIDAB in the last 72 hours. Anemia Panel: No results for input(s): VITAMINB12, FOLATE, FERRITIN, TIBC, IRON, RETICCTPCT in the last 72 hours. Urine analysis: No results found for: COLORURINE, APPEARANCEUR, LABSPEC, PHURINE, GLUCOSEU, HGBUR, BILIRUBINUR, KETONESUR, PROTEINUR, UROBILINOGEN, NITRITE, LEUKOCYTESUR Sepsis Labs: @LABRCNTIP (procalcitonin:4,lacticidven:4)  ) Recent Results (from the past 240 hour(s))  Culture, blood (routine x 2)     Status: None (Preliminary result)   Collection Time: 05/02/17  7:54 PM  Result Value Ref Range Status   Specimen Description   Final    BLOOD RIGHT HAND Performed at [place] [address], [place], [place] [address]    Special Requests   Final    BOTTLES DRAWN AEROBIC AND ANAEROBIC Blood Culture adequate volume Performed at [facility] [address] [place], [place] [address]    Culture   Final    NO GROWTH 3  DAYS Performed at [facility], [place] [address] [place], [place] [address]    Report Status PENDING  Incomplete  Wound or Superficial Culture     Status: None   Collection Time: 05/02/17 10:55 PM  Result Value Ref Range Status   Specimen Description   Final    WOUND RIGHT KNEE Performed at [place] [address], [place], [place] [address]    Special Requests   Final    NONE Performed at [facility], [place] [address], [place], [place] [address]    Gram Stain   Final    FEW WBC PRESENT, PREDOMINANTLY PMN FEW GRAM POSITIVE COCCI IN PAIRS RARE GRAM VARIABLE ROD Performed at [facility], [place] [address], [place],  [address]    Culture   Final    FEW DIPHTHEROIDS(CORYNEBACTERIUM SPECIES) Standardized susceptibility testing for this organism is not available. RARE VIRIDANS STREPTOCOCCUS    Report Status 05/05/2017 FINAL  Final  MRSA PCR Screening     Status: None  Collection Time: 05/02/17 11:14 PM  Result Value Ref Range Status   MRSA by PCR NEGATIVE NEGATIVE Final    Comment:        The GeneXpert MRSA Assay (FDA approved for NASAL specimens only), is one component of a comprehensive MRSA colonization surveillance program. It is not intended to diagnose MRSA infection nor to guide or monitor treatment for MRSA infections. Performed at [facility], [place] [address], [place], [place] [address]   Culture, blood (routine x 2)     Status: None (Preliminary result)   Collection Time: 05/03/17  5:22 AM  Result Value Ref Range Status   Specimen Description   Final    BLOOD RIGHT ANTECUBITAL Performed at [place] [address], [place], [place] [address]    Special Requests   Final    BOTTLES DRAWN AEROBIC ONLY Blood Culture adequate volume Performed at [place] [address] [place], [place] [address]    Culture   Final    NO GROWTH 3  DAYS Performed at [facility], [place] [address] [place], [place] [address]    Report Status PENDING  Incomplete  Body fluid culture     Status: None (Preliminary result)   Collection Time: 05/03/17  1:30 PM  Result Value Ref Range Status   Specimen Description   Final    SYNOVIAL RIGHT KNEE Performed at [facility], [place] [address] [place], [place] [address]    Special Requests   Final    NONE Performed at [facility], [place] [address], [place], [place] [address]    Gram Stain   Final    FEW WBC PRESENT, PREDOMINANTLY PMN FEW GRAM POSITIVE COCCI IN PAIRS RARE GRAM VARIABLE ROD Gram Stain Report Called to,Read Back By and Verified With: PELLETIER,E. RN @1459  ON 03.28.19 BY COHEN,K Performed at [facility], [place] [address] [place], [place] [address]    Culture   Final    NO GROWTH 3 DAYS Performed at [facility], [place] [address] [place] [address] [place], [place] [address]    Report Status PENDING  Incomplete         Radiology Studies: No results found.      Scheduled Meds: . atorvastatin  40 mg Oral Daily  . chlorthalidone  25 mg Oral Daily  . [START ON 05/09/2017] cloNIDine  0.2 mg Transdermal Weekly  . divalproex  500 mg Oral Daily  . enoxaparin (LOVENOX) injection  40 mg Subcutaneous QHS  . feeding supplement (ENSURE ENLIVE)  237 mL Oral BID BM  . hydrALAZINE  75 mg Oral TID  . metoprolol tartrate  50 mg Oral BID  . multivitamin with minerals  1 tablet Oral Daily  . NIFEdipine  60 mg Oral Daily   Continuous Infusions:   LOS: 3 days    Time spent: 36 minutes    GARBA,LAWAL, MD [facility] Pager [phone]  If 7PM-7AM, please contact night-coverage [email or web address] Password [facility] 05/06/2017, 2:23 PM

## 2017-05-06 NOTE — Progress Notes (Signed)
Subjective:   Procedure(s) (LRB): IRRIGATION AND DEBRIDEMENT EXTREMITY (Right) Patient reports pain as mild.    Objective: Vital signs in last 24 hours: Temp:  [98.1 F (36.7 C)-98.2 F (36.8 C)] 98.2 F (36.8 C) (03/31 0700) Pulse Rate:  [61-74] 74 (03/31 0913) Resp:  [18] 18 (03/30 1253) BP: (148-158)/(72-81) 148/80 (03/31 0913) SpO2:  [96 %-99 %] 96 % (03/31 0700)  Intake/Output from previous day: No intake/output data recorded. Intake/Output this shift: No intake/output data recorded.  Recent Labs    05/04/17 0506 05/05/17 0704  HGB 10.6* 9.4*   Recent Labs    05/04/17 0506 05/05/17 0704  WBC 11.4* 9.8  RBC 4.69 4.18  HCT 35.2* 31.3*  PLT 490* 388   Recent Labs    05/04/17 0506 05/06/17 0455  NA 139 138  K 3.9 3.9  CL 106 105  CO2 24 23  BUN 17 29*  CREATININE 0.70 1.05*  GLUCOSE 111* 98  CALCIUM 9.2 9.1   No results for input(s): LABPT, INR in the last 72 hours.  Neurovascular intact Sensation intact distally Intact pulses distally Dorsiflexion/Plantar flexion intact Incision: dressing C/D/I and scant drainage minimal erythema in the lower extremity none surrounding the wound, minimal drainage on packing   Assessment/Plan:   Procedure(s) (LRB): IRRIGATION AND DEBRIDEMENT EXTREMITY (Right)  Negative cultures/gram stains, not on abx currently, symptoms improving, chronic wound right knee, not likely to need I&D would recommend local wound care as normal and f/u with her wound clinic.    Vonna Kotyk Theus Espin 05/06/2017, 11:27 AM

## 2017-05-07 LAB — BASIC METABOLIC PANEL
ANION GAP: 9 (ref 5–15)
BUN: 26 mg/dL — ABNORMAL HIGH (ref 6–20)
CHLORIDE: 104 mmol/L (ref 101–111)
CO2: 23 mmol/L (ref 22–32)
CREATININE: 0.97 mg/dL (ref 0.44–1.00)
Calcium: 9 mg/dL (ref 8.9–10.3)
GFR calc Af Amer: >60 mL/min (ref >60)
GFR, EST NON AFRICAN AMERICAN: 57 mL/min — AB (ref >60)
GLUCOSE: 92 mg/dL (ref 65–99)
Potassium: 3.6 mmol/L (ref 3.5–5.1)
Sodium: 136 mmol/L (ref 135–145)

## 2017-05-07 NOTE — Care Management Note (Signed)
Case Management Note  Patient Details  Name: Tamara Howe MRN: [id number] Date of Birth: Jan 01, 1946  Subjective/Objective: 72 yr old female admitted with left lower leg cellulitis.                   Action/Plan: Case manager spoke with patient and her husband concerning discharge plan. Choice for Home Health Agency was offered. Referral was called to [facility] with [facility] in [place], [place], CM faxed orders, F2F, Demographics, H&P and PT eval to her at [phone]. Patient has DME., will have family support at discharge.   Expected Discharge Date:  05/07/17               Expected Discharge Plan:  [place]  In-House Referral:     Discharge planning Services  CM Consult  Post Acute Care Choice:  Home Health Choice offered to:  Patient, Spouse  DME Arranged:  N/A DME Agency:  NA  HH Arranged:   PT HH Agency:  [facility]  Status of Service:  Completed, signed off  If discussed at [place] of Stay Meetings, dates discussed:    Additional Comments:  Ninfa Meeker, RN 05/07/2017, 12:34 PM

## 2017-05-07 NOTE — Discharge Summary (Signed)
Physician Discharge Summary  Tamara Howe EVO:[id number] DOB: Nov 29, 1945 DOA: 05/02/2017  PCP: Tish Frederickson, [facility]  Admit date: 05/02/2017 Discharge date: 05/07/2017  Time spent: 33 minutes  Recommendations for Outpatient Follow-up:  1. Return to wound care center 2. Continue home health care  Discharge Diagnoses:  Principal Problem:   Cellulitis of right lower extremity Active Problems:   Essential hypertension   Microcytic hypochromic anemia   Anxiety   Discharge Condition: Good  Diet recommendation: Heart healthy  Filed Weights   05/03/17 0000 05/03/17 0321 05/04/17 0214  Weight: 55.8 kg (123 lb) 60 kg (132 lb 4.4 oz) 59.3 kg (130 lb 11.7 oz)    History of present illness:  Tamara Howe a 72 y.o.femalewith medical history significant ofHTN, chronic right lower extremity wound, cellulitis, chronic pain, anemia;who presents with worsening wound and erythema of the right lower extremity. Patient has had his right lower extremity wound since December of 2018 that shenoted after having a joint injection. Last admitted to the hospital from 1/1-1/6 for sepsis thought to be secondary to right lower extremity cellulitis with wound cultures positive for MSSA. Patient was transferred from [facility] to [facility] for possible orthopedic surgery    Hospital Course:  Patient was seen by orthopedics and infectious disease in the hospital.  She was initially on IV antibiotics for suspected cellulitis and possible abscess of the knee.  Aspiration of the knee was done showing no evidence of bacteria.  The redness on the swelling of the knee improved.  Infectious disease discontinued the antibiotics and patient improved.  It appears what she needed a small wound care which was provided for in the hospital.  She previously goes to the wound care center.  Patient was originally at [facility] but transferred to [facility] for possible orthopedic surgery  however with no infection seen surgery was discontinued.  She was seen by wound care in the hospital.  Supportive care was given and she was doing much better at time of discharge.  Procedures:  Incision and drainage with aspiration of the joint  Consultations:  Orthopedics, Dr Orion Crook  Dr Michel Bickers, Infectious Disease      Discharge Exam: Vitals:   05/07/17 0859 05/07/17 0900  BP: 127/84 127/84  Pulse:    Resp:    Temp:    SpO2:      General: Stable, NAD Cardiovascular: RRR Respiratory: Good AE bilaterally no Wheeze or Rales  Discharge Instructions   Discharge Instructions    Diet - low sodium heart healthy   Complete by:  As directed    Increase activity slowly   Complete by:  As directed      Allergies as of 05/07/2017      Reactions   Aspirin Shortness Of Breath   respiratory distress   Codeine Nausea Only   Peanut (diagnostic) Anaphylaxis   hives and wheezing, anaphylaxis   Penicillins Anaphylaxis   Has patient had a PCN reaction causing immediate rash, facial/tongue/throat swelling, SOB or lightheadedness with hypotension: Yes Has patient had a PCN reaction causing severe rash involving mucus membranes or skin necrosis: No Has patient had a PCN reaction that required hospitalization Yes Has patient had a PCN reaction occurring within the last 10 years: No If all of the above answers are "NO", then may proceed with Cephalosporin use.   Shellfish Allergy Anaphylaxis   Respiratory arrest   Tetracycline Anaphylaxis   Respiratory arrest   Cephalexin Hives   Iodinated Diagnostic  Agents Hives   Latex Hives   Morphine Other (See Comments)   Possible resp arrest?? Patient is unsure   Nitrofuran Derivatives Other (See Comments)   Unknown by patient   Sulfa Antibiotics Hives      Medication List    STOP taking these medications   clindamycin 150 MG capsule Commonly known as:  CLEOCIN   levofloxacin 500 MG tablet Commonly known as:   LEVAQUIN     TAKE these medications   acetaminophen 500 MG tablet Commonly known as:  TYLENOL Take 1,000 mg by mouth every 6 (six) hours as needed for moderate pain.   PROVENTIL HFA 108 (90 Base) MCG/ACT inhaler Generic drug:  albuterol Inhale 2 puffs into the lungs every 6 (six) hours as needed for wheezing or shortness of breath.   albuterol (2.5 MG/3ML) 0.083% nebulizer solution Commonly known as:  PROVENTIL Take 2.5 mg by nebulization every 6 (six) hours as needed for wheezing or shortness of breath.   atorvastatin 40 MG tablet Commonly known as:  LIPITOR Take 40 mg by mouth daily.   chlorthalidone 25 MG tablet Commonly known as:  HYGROTON Take 25 mg by mouth daily.   clonazePAM 0.5 MG tablet Commonly known as:  KLONOPIN Take 0.5 mg by mouth 2 (two) times daily as needed for anxiety.   cloNIDine 0.2 mg/24hr patch Commonly known as:  CATAPRES - Dosed in mg/24 hr Place 0.2 mg onto the skin once a week.   divalproex 500 MG 24 hr tablet Commonly known as:  DEPAKOTE ER Take 500 mg by mouth daily.   EPIPEN 2-PAK 0.3 mg/0.3 mL Soaj injection Generic drug:  EPINEPHrine Inject 0.3 mg into the muscle once. For anaphylaxis   hydrALAZINE 50 MG tablet Commonly known as:  APRESOLINE Take 75 mg by mouth 3 (three) times daily.   HYDROcodone-acetaminophen 10-325 MG tablet Commonly known as:  NORCO Take 1 tablet by mouth every 6 (six) hours as needed for moderate pain.   metoprolol tartrate 50 MG tablet Commonly known as:  LOPRESSOR Take 1 tablet (50 mg total) by mouth 2 (two) times daily.   NIFEdipine 60 MG 24 hr tablet Commonly known as:  PROCARDIA-XL/ADALAT CC Take 60 mg by mouth daily.   potassium chloride SA 20 MEQ tablet Commonly known as:  K-DUR,KLOR-CON Take 1 tablet (20 mEq total) by mouth daily. Take only when taking the Lasix   spironolactone 25 MG tablet Commonly known as:  ALDACTONE Take 25 mg by mouth daily.   Vitamin D (Ergocalciferol) 50000 units Caps  capsule Commonly known as:  DRISDOL Take 50,000 Units by mouth every Monday, Wednesday, and Friday.      Allergies  Allergen Reactions  . Aspirin Shortness Of Breath    respiratory distress  . Codeine Nausea Only  . Peanut (Diagnostic) Anaphylaxis    hives and wheezing, anaphylaxis  . Penicillins Anaphylaxis    Has patient had a PCN reaction causing immediate rash, facial/tongue/throat swelling, SOB or lightheadedness with hypotension: Yes Has patient had a PCN reaction causing severe rash involving mucus membranes or skin necrosis: No Has patient had a PCN reaction that required hospitalization Yes Has patient had a PCN reaction occurring within the last 10 years: No If all of the above answers are "NO", then may proceed with Cephalosporin use.   . Shellfish Allergy Anaphylaxis    Respiratory arrest  . Tetracycline Anaphylaxis    Respiratory arrest  . Cephalexin Hives  . Iodinated Diagnostic Agents Hives  . Latex Hives  .  Morphine Other (See Comments)    Possible resp arrest?? Patient is unsure  . Nitrofuran Derivatives Other (See Comments)    Unknown by patient  . Sulfa Antibiotics Hives      The results of significant diagnostics from this hospitalization (including imaging, microbiology, ancillary and laboratory) are listed below for reference.    Significant Diagnostic Studies: Dg Tibia/fibula Right  Result Date: 05/02/2017 CLINICAL DATA:  Knee and lower leg swelling and erythema. Likely infection. EXAM: RIGHT TIBIA AND FIBULA - 2 VIEW COMPARISON:  None. FINDINGS: Soft tissue swelling is evident about the knee. Healed proximal fibular fracture is noted. Mild osteopenia is present. No other focal soft tissue abnormalities are present in the lower leg. The ankle joint is located. IMPRESSION: Proximal soft tissue swelling about the knee. Please see knee radiograph report of the same day. The more distal tibia and fibula demonstrate no acute bone or soft tissue  abnormalities. Electronically Signed   By: San Morelle M.D.   On: 05/02/2017 20:33   Ct Knee Right Wo Contrast  Result Date: 05/03/2017 CLINICAL DATA:  72 year old female with persistent wound inferior to the right knee and poor wound healing. Concern for osteomyelitis. EXAM: CT OF THE right KNEE WITHOUT CONTRAST TECHNIQUE: Multidetector CT imaging of the right knee was performed according to the standard protocol. Multiplanar CT image reconstructions were also generated. COMPARISON:  Right knee radiograph dated 05/02/2017 and CT dated 02/07/2017 FINDINGS: Evaluation of this exam is limited in the absence of intravenous contrast. Bones/Joint/Cartilage There is no acute fracture or dislocation. Old healed fracture of the proximal third of the fibular diaphysis. No periosteal elevation. Severe arthritic changes of the knee with sclerotic changes involving the lateral compartment along the lateral tibial plateau and femoral condyle. There is valgus tilt of the knee as seen on the prior CT. There is multiple osteophytes and bone spur. There is diffuse thickening of the synovium and knee capsule with moderate amount of joint effusion which is concerning for an infectious process, possibly chronic or an inflammatory synovitis. Correlation with clinical exam and joint fluid analysis recommended. There is thickening of the soft tissues inferior to the medial knee with a focal area of skin defect consistent with known ulcer/wound. Ligaments Suboptimally assessed by CT. Muscles and Tendons No intramuscular gas or fluid collection. Soft tissues Diffuse subcutaneous edema and subcutaneous soft tissue thickening and skin ulcer inferior to the medial knee. IMPRESSION: 1. Diffuse synovial thickening with moderate joint effusion as seen on the prior CT consistent with an infectious or inflammatory synovitis. Correlation with clinical exam and joint fluid analysis recommended. 2. Severe arthropathy of the knee with  valgus tilt similar to prior CT. 3. Skin wound inferior to the medial knee. 4. No acute fracture or dislocation.  Osteopenia. Electronically Signed   By: Anner Crete M.D.   On: 05/03/2017 01:08   Ct Tibia Fibula Right Wo Contrast  Result Date: 05/03/2017 CLINICAL DATA:  72 year old female with persistent wound inferior to the right knee and poor wound healing. Concern for osteomyelitis. EXAM: CT OF THE right KNEE WITHOUT CONTRAST TECHNIQUE: Multidetector CT imaging of the right knee was performed according to the standard protocol. Multiplanar CT image reconstructions were also generated. COMPARISON:  Right knee radiograph dated 05/02/2017 and CT dated 02/07/2017 FINDINGS: Evaluation of this exam is limited in the absence of intravenous contrast. Bones/Joint/Cartilage There is no acute fracture or dislocation. Old healed fracture of the proximal third of the fibular diaphysis. No periosteal elevation. Severe arthritic changes  of the knee with sclerotic changes involving the lateral compartment along the lateral tibial plateau and femoral condyle. There is valgus tilt of the knee as seen on the prior CT. There is multiple osteophytes and bone spur. There is diffuse thickening of the synovium and knee capsule with moderate amount of joint effusion which is concerning for an infectious process, possibly chronic or an inflammatory synovitis. Correlation with clinical exam and joint fluid analysis recommended. There is thickening of the soft tissues inferior to the medial knee with a focal area of skin defect consistent with known ulcer/wound. Ligaments Suboptimally assessed by CT. Muscles and Tendons No intramuscular gas or fluid collection. Soft tissues Diffuse subcutaneous edema and subcutaneous soft tissue thickening and skin ulcer inferior to the medial knee. IMPRESSION: 1. Diffuse synovial thickening with moderate joint effusion as seen on the prior CT consistent with an infectious or inflammatory  synovitis. Correlation with clinical exam and joint fluid analysis recommended. 2. Severe arthropathy of the knee with valgus tilt similar to prior CT. 3. Skin wound inferior to the medial knee. 4. No acute fracture or dislocation.  Osteopenia. Electronically Signed   By: Anner Crete M.D.   On: 05/03/2017 01:08   Dg Knee Complete 4 Views Right  Result Date: 05/02/2017 CLINICAL DATA:  Knee and lower leg swelling and erythema. EXAM: RIGHT KNEE - COMPLETE 4+ VIEW COMPARISON:  Right knee radiographs 03/28/2017. FINDINGS: Lateral tibial plateau collapse and sclerosis of the lateral compartment of the knee is again seen. Changes of the lateral femoral condyle demonstrate some progression. There is a dressing or drain at the site of the previously noted ulcer. A moderate-sized joint effusion remains. No osseous destruction is evident. Proximal fibular remote fracture is noted. IMPRESSION: 1. Progressive sclerotic changes of the lateral femoral condyle. 2. Stable changes of the lateral tibial plateau. 3. No focal osseous destruction. 4. Persistent moderate effusion. 5. Drain or dressing in the anterior medial soft tissues of the lower leg. Electronically Signed   By: San Morelle M.D.   On: 05/02/2017 20:32    Microbiology: Recent Results (from the past 240 hour(s))  Culture, blood (routine x 2)     Status: None (Preliminary result)   Collection Time: 05/02/17  7:54 PM  Result Value Ref Range Status   Specimen Description   Final    BLOOD RIGHT HAND Performed at [facility] [address] [place], [place] [address]    Special Requests   Final    BOTTLES DRAWN AEROBIC AND ANAEROBIC Blood Culture adequate volume Performed at [facility] [address] [place], [place] [address]    Culture   Final    NO GROWTH 3 DAYS Performed at [facility], [place] [address] [place], [place] [address]    Report Status PENDING  Incomplete  Wound or  Superficial Culture     Status: None   Collection Time: 05/02/17 10:55 PM  Result Value Ref Range Status   Specimen Description   Final    WOUND RIGHT KNEE Performed at [facility] [address], [place], [place] [address]    Special Requests   Final    NONE Performed at [facility], [place] [address] [place], [place] [address]    Gram Stain   Final    FEW WBC PRESENT, PREDOMINANTLY PMN FEW GRAM POSITIVE COCCI IN PAIRS RARE GRAM VARIABLE ROD Performed at [facility], [place] [address] [place],  [address]  Culture   Final    FEW DIPHTHEROIDS(CORYNEBACTERIUM SPECIES) Standardized susceptibility testing for this organism is not available. RARE VIRIDANS STREPTOCOCCUS    Report Status 05/05/2017 FINAL  Final  MRSA PCR Screening     Status: None   Collection Time: 05/02/17 11:14 PM  Result Value Ref Range Status   MRSA by PCR NEGATIVE NEGATIVE Final    Comment:        The GeneXpert MRSA Assay (FDA approved for NASAL specimens only), is one component of a comprehensive MRSA colonization surveillance program. It is not intended to diagnose MRSA infection nor to guide or monitor treatment for MRSA infections. Performed at [facility], [place] [address], [place], [place] [address]   Culture, blood (routine x 2)     Status: None (Preliminary result)   Collection Time: 05/03/17  5:22 AM  Result Value Ref Range Status   Specimen Description   Final    BLOOD RIGHT ANTECUBITAL Performed at [facility] [address], [place], [place] [address]    Special Requests   Final    BOTTLES DRAWN AEROBIC ONLY Blood Culture adequate volume Performed at [facility] [address] [place], [place] [address]    Culture   Final    NO GROWTH 3 DAYS Performed at [facility], [place] [address], [place], [place] [address]    Report Status PENDING  Incomplete  Body fluid culture      Status: None (Preliminary result)   Collection Time: 05/03/17  1:30 PM  Result Value Ref Range Status   Specimen Description   Final    SYNOVIAL RIGHT KNEE Performed at [facility], [place] [address] [place], [place] [address]    Special Requests   Final    NONE Performed at [facility], [place] [address] [place], [place] [address]    Gram Stain   Final    FEW WBC PRESENT, PREDOMINANTLY PMN FEW GRAM POSITIVE COCCI IN PAIRS RARE GRAM VARIABLE ROD Gram Stain Report Called to,Read Back By and Verified With: PELLETIER,E. RN @1459  ON 03.28.19 BY COHEN,K Performed at [facility], [place] [address] [place], [place] [address]    Culture   Final    NO GROWTH 3 DAYS Performed at [facility], [place] [address] [place], [place] [address]    Report Status PENDING  Incomplete     Labs: Basic Metabolic Panel: Recent Labs  Lab 05/02/17 1954 05/03/17 0522 05/04/17 0506 05/06/17 0455 05/07/17 0513  NA 141 140 139 138 136  K 4.0 3.5 3.9 3.9 3.6  CL 108 108 106 105 104  CO2 25 22 24 23 23   GLUCOSE 102* 115* 111* 98 92  BUN 22* 17 17 29* 26*  CREATININE 0.78 0.70 0.70 1.05* 0.97  CALCIUM 9.3 9.2 9.2 9.1 9.0   Liver Function Tests: Recent Labs  Lab 05/02/17 1954  AST 21  ALT 13*  ALKPHOS 64  BILITOT 0.2*  PROT 6.8  ALBUMIN 3.6   No results for input(s): LIPASE, AMYLASE in the last 168 hours. No results for input(s): AMMONIA in the last 168 hours. CBC: Recent Labs  Lab 05/02/17 1954 05/03/17 0522 05/04/17 0506 05/05/17 0704  WBC 10.3 12.3* 11.4* 9.8  NEUTROABS 5.8  --   --   --   HGB 9.8* 10.9* 10.6* 9.4*  HCT 32.1* 36.0 35.2* 31.3*  MCV 76.2* 75.9* 75.1* 74.9*  PLT 420* 482* 490* 388  Cardiac Enzymes: No results for input(s): CKTOTAL, CKMB, CKMBINDEX, TROPONINI in the last 168 hours. BNP: BNP (last 3 results) No results for input(s): BNP in the last 8760 hours.  ProBNP (last 3 results) No results for input(s):  PROBNP in the last 8760 hours.  CBG: No results for input(s): GLUCAP in the last 168 hours.     SignedBarbette Merino MD.  [facility] 05/07/2017, 10:59 AM

## 2017-05-07 NOTE — Progress Notes (Signed)
Patient was provided with written and verbal discharge instructions.  Verbalizes understanding instructions and the importance of her follow up appointments.  Wound care addressed with this patient.

## 2017-05-07 NOTE — Care Management Important Message (Signed)
Important Message  Patient Details  Name: Tamara Howe MRN: [id number] Date of Birth: November 04, 1945   Medicare Important Message Given:  Yes    Orbie Pyo 05/07/2017, 3:02 PM

## 2017-05-08 ENCOUNTER — Encounter (HOSPITAL_BASED_OUTPATIENT_CLINIC_OR_DEPARTMENT_OTHER): Payer: Medicare Other | Attending: Internal Medicine

## 2017-05-08 DIAGNOSIS — I10 Essential (primary) hypertension: Secondary | ICD-10-CM | POA: Insufficient documentation

## 2017-05-08 DIAGNOSIS — L97812 Non-pressure chronic ulcer of other part of right lower leg with fat layer exposed: Secondary | ICD-10-CM | POA: Insufficient documentation

## 2017-05-08 LAB — BODY FLUID CULTURE: Culture: NO GROWTH

## 2017-05-08 LAB — CULTURE, BLOOD (ROUTINE X 2)
CULTURE: NO GROWTH
CULTURE: NO GROWTH
SPECIAL REQUESTS: ADEQUATE
SPECIAL REQUESTS: ADEQUATE

## 2017-05-21 ENCOUNTER — Ambulatory Visit: Payer: Medicare Other | Admitting: Family

## 2017-05-21 NOTE — Progress Notes (Deleted)
Subjective:    Patient ID: Tamara Howe, female    DOB: 1945-02-27, 72 y.o.   MRN: [id number]  No chief complaint on file.    HPI:  Tamara Howe is a 72 y.o. female who presents today for routine follow-up of MSSA infection.  Tamara Howe was recently seen in the office on 3/19 for MSSA infection from a previous knee injection with complicated healing and no evidence of osteomyelitis. She was continued on her levofloxacin at the time. She was then admitted to the hospital on 3/27 following a wound care appointment where she was thought to have worsening infection and possible osteomyelitis. Her knee was aspirated and found to have clear yellow fluid with 680 white blood cells and 49% segmented neutrophils. Initial Gram stain was positive for gram-positive cocci in pairs and gram-variable rods. Culture was negative for any organisms with normal sedimentation rate and C-reactive protein. CT scans showed diffuse synovial thickening with moderate joint effusion. No additional antibiotics are recommended. She was discharged in improved condition. All hospital records, labs, and imaging reviewed in detail.  Since leaving the hospital   Allergies  Allergen Reactions  . Aspirin Shortness Of Breath    respiratory distress  . Codeine Nausea Only  . Peanut (Diagnostic) Anaphylaxis    hives and wheezing, anaphylaxis  . Penicillins Anaphylaxis    Has patient had a PCN reaction causing immediate rash, facial/tongue/throat swelling, SOB or lightheadedness with hypotension: Yes Has patient had a PCN reaction causing severe rash involving mucus membranes or skin necrosis: No Has patient had a PCN reaction that required hospitalization Yes Has patient had a PCN reaction occurring within the last 10 years: No If all of the above answers are "NO", then may proceed with Cephalosporin use.   . Shellfish Allergy Anaphylaxis    Respiratory arrest  . Tetracycline Anaphylaxis    Respiratory arrest  .  Cephalexin Hives  . Iodinated Diagnostic Agents Hives  . Latex Hives  . Morphine Other (See Comments)    Possible resp arrest?? Patient is unsure  . Nitrofuran Derivatives Other (See Comments)    Unknown by patient  . Sulfa Antibiotics Hives      Outpatient Medications Prior to Visit  Medication Sig Dispense Refill  . acetaminophen (TYLENOL) 500 MG tablet Take 1,000 mg by mouth every 6 (six) hours as needed for moderate pain.    Marland Kitchen albuterol (PROVENTIL HFA) 108 (90 Base) MCG/ACT inhaler Inhale 2 puffs into the lungs every 6 (six) hours as needed for wheezing or shortness of breath.    Marland Kitchen albuterol (PROVENTIL) (2.5 MG/3ML) 0.083% nebulizer solution Take 2.5 mg by nebulization every 6 (six) hours as needed for wheezing or shortness of breath.    Marland Kitchen atorvastatin (LIPITOR) 40 MG tablet Take 40 mg by mouth daily.    . chlorthalidone (HYGROTON) 25 MG tablet Take 25 mg by mouth daily.    . clonazePAM (KLONOPIN) 0.5 MG tablet Take 0.5 mg by mouth 2 (two) times daily as needed for anxiety.    . cloNIDine (CATAPRES - DOSED IN MG/24 HR) 0.2 mg/24hr patch Place 0.2 mg onto the skin once a week.    . divalproex (DEPAKOTE ER) 500 MG 24 hr tablet Take 500 mg by mouth daily.    Marland Kitchen EPINEPHrine (EPIPEN 2-PAK) 0.3 mg/0.3 mL IJ SOAJ injection Inject 0.3 mg into the muscle once. For anaphylaxis    . hydrALAZINE (APRESOLINE) 50 MG tablet Take 75 mg by mouth 3 (three) times daily.    Marland Kitchen  HYDROcodone-acetaminophen (NORCO) 10-325 MG tablet Take 1 tablet by mouth every 6 (six) hours as needed for moderate pain.     . metoprolol tartrate (LOPRESSOR) 50 MG tablet Take 1 tablet (50 mg total) by mouth 2 (two) times daily. 60 tablet 0  . NIFEdipine (PROCARDIA-XL/ADALAT CC) 60 MG 24 hr tablet Take 60 mg by mouth daily.    . potassium chloride SA (K-DUR,KLOR-CON) 20 MEQ tablet Take 1 tablet (20 mEq total) by mouth daily. Take only when taking the Lasix 5 tablet 0  . spironolactone (ALDACTONE) 25 MG tablet Take 25 mg by mouth  daily.    . Vitamin D, Ergocalciferol, (DRISDOL) 50000 units CAPS capsule Take 50,000 Units by mouth every Monday, Wednesday, and Friday.      No facility-administered medications prior to visit.      Past Medical History:  Diagnosis Date  . Abdominal mass, LLQ (left lower quadrant)   . Anemia   . Anxiety   . Asthma   . Bladder infection, chronic   . Cellulitis and abscess of leg 02/08/2017  . Chest pain   . Chronic pain   . Complication of anesthesia    "stopped breathing with tumor removal surgery, not happened since then"  . Constipation   . Cough   . Depression   . Dietary counseling   . Dysuria   . Edema extremities   . Enthesopathy of knee   . Esophagitis   . GERD (gastroesophageal reflux disease)   . Headache   . Heart murmur   . High risk medication use   . History of bronchitis   . Hypercholesterolemia   . Hypertension   . Insomnia   . Ischemic cerebrovascular accident (CVA) of frontal lobe ([place])   . Lipoma of skin   . Low back pain   . Malaise and fatigue   . Nocturia   . Noncompliance with medications   . Osteoarthritis   . PONV (postoperative nausea and vomiting)   . Prediabetes   . Rash   . Shortness of breath dyspnea   . Vitamin D deficiency      Past Surgical History:  Procedure Laterality Date  . ABDOMINAL HYSTERECTOMY    . ANKLE SURGERY Right   . APPENDECTOMY    . CARDIAC CATHETERIZATION     12/13/12 [facility], Dr. Gibson Ramp): 30-40% mid LAD.  Marland Kitchen SALIVARY GLAND SURGERY    . TUMOR EXCISION         Review of Systems    Objective:    There were no vitals taken for this visit. Nursing note and vital signs reviewed.  Physical Exam     Assessment & Plan:   Problem List Items Addressed This Visit    None       I am having Tamara Howe maintain her HYDROcodone-acetaminophen, divalproex, NIFEdipine, chlorthalidone, clonazePAM, albuterol, Vitamin D (Ergocalciferol), acetaminophen, EPINEPHrine, albuterol, cloNIDine, potassium chloride  SA, metoprolol tartrate, atorvastatin, hydrALAZINE, and spironolactone.   No orders of the defined types were placed in this encounter.    Follow-up: No follow-ups on file.   Terri Piedra, MSN, [facility]

## 2017-05-22 DIAGNOSIS — I10 Essential (primary) hypertension: Secondary | ICD-10-CM | POA: Diagnosis not present

## 2017-05-22 DIAGNOSIS — L97812 Non-pressure chronic ulcer of other part of right lower leg with fat layer exposed: Secondary | ICD-10-CM | POA: Diagnosis present

## 2017-06-04 DIAGNOSIS — L97812 Non-pressure chronic ulcer of other part of right lower leg with fat layer exposed: Secondary | ICD-10-CM | POA: Diagnosis not present

## 2017-06-18 ENCOUNTER — Encounter (HOSPITAL_BASED_OUTPATIENT_CLINIC_OR_DEPARTMENT_OTHER): Payer: Medicare Other | Attending: Internal Medicine

## 2017-06-18 DIAGNOSIS — I10 Essential (primary) hypertension: Secondary | ICD-10-CM | POA: Insufficient documentation

## 2017-06-18 DIAGNOSIS — L97816 Non-pressure chronic ulcer of other part of right lower leg with bone involvement without evidence of necrosis: Secondary | ICD-10-CM | POA: Insufficient documentation

## 2017-06-18 DIAGNOSIS — I739 Peripheral vascular disease, unspecified: Secondary | ICD-10-CM | POA: Insufficient documentation

## 2017-06-18 DIAGNOSIS — Z8673 Personal history of transient ischemic attack (TIA), and cerebral infarction without residual deficits: Secondary | ICD-10-CM | POA: Insufficient documentation

## 2017-06-18 DIAGNOSIS — J449 Chronic obstructive pulmonary disease, unspecified: Secondary | ICD-10-CM | POA: Insufficient documentation

## 2017-06-18 DIAGNOSIS — Z87891 Personal history of nicotine dependence: Secondary | ICD-10-CM | POA: Insufficient documentation

## 2017-06-27 DIAGNOSIS — I10 Essential (primary) hypertension: Secondary | ICD-10-CM | POA: Diagnosis not present

## 2017-06-27 DIAGNOSIS — L97816 Non-pressure chronic ulcer of other part of right lower leg with bone involvement without evidence of necrosis: Secondary | ICD-10-CM | POA: Diagnosis present

## 2017-06-27 DIAGNOSIS — I739 Peripheral vascular disease, unspecified: Secondary | ICD-10-CM | POA: Diagnosis not present

## 2017-06-27 DIAGNOSIS — Z87891 Personal history of nicotine dependence: Secondary | ICD-10-CM | POA: Diagnosis not present

## 2017-06-27 DIAGNOSIS — Z8673 Personal history of transient ischemic attack (TIA), and cerebral infarction without residual deficits: Secondary | ICD-10-CM | POA: Diagnosis not present

## 2017-06-27 DIAGNOSIS — J449 Chronic obstructive pulmonary disease, unspecified: Secondary | ICD-10-CM | POA: Diagnosis not present

## 2017-07-11 ENCOUNTER — Encounter (HOSPITAL_BASED_OUTPATIENT_CLINIC_OR_DEPARTMENT_OTHER): Payer: Medicare Other | Attending: Physician Assistant

## 2017-07-11 DIAGNOSIS — I10 Essential (primary) hypertension: Secondary | ICD-10-CM | POA: Insufficient documentation

## 2017-07-11 DIAGNOSIS — Z872 Personal history of diseases of the skin and subcutaneous tissue: Secondary | ICD-10-CM | POA: Insufficient documentation

## 2017-07-11 DIAGNOSIS — Z09 Encounter for follow-up examination after completed treatment for conditions other than malignant neoplasm: Secondary | ICD-10-CM | POA: Insufficient documentation

## 2017-07-25 DIAGNOSIS — Z872 Personal history of diseases of the skin and subcutaneous tissue: Secondary | ICD-10-CM | POA: Diagnosis not present

## 2017-07-25 DIAGNOSIS — I10 Essential (primary) hypertension: Secondary | ICD-10-CM | POA: Diagnosis not present

## 2017-07-25 DIAGNOSIS — Z09 Encounter for follow-up examination after completed treatment for conditions other than malignant neoplasm: Secondary | ICD-10-CM | POA: Diagnosis present

## 2017-12-25 ENCOUNTER — Encounter (HOSPITAL_BASED_OUTPATIENT_CLINIC_OR_DEPARTMENT_OTHER): Payer: Medicare Other | Attending: Internal Medicine

## 2017-12-25 DIAGNOSIS — Z87891 Personal history of nicotine dependence: Secondary | ICD-10-CM | POA: Diagnosis not present

## 2017-12-25 DIAGNOSIS — M79672 Pain in left foot: Secondary | ICD-10-CM | POA: Insufficient documentation

## 2017-12-25 DIAGNOSIS — B353 Tinea pedis: Secondary | ICD-10-CM | POA: Insufficient documentation

## 2017-12-25 DIAGNOSIS — I10 Essential (primary) hypertension: Secondary | ICD-10-CM | POA: Insufficient documentation

## 2017-12-25 DIAGNOSIS — Z8673 Personal history of transient ischemic attack (TIA), and cerebral infarction without residual deficits: Secondary | ICD-10-CM | POA: Insufficient documentation

## 2017-12-25 DIAGNOSIS — G9009 Other idiopathic peripheral autonomic neuropathy: Secondary | ICD-10-CM | POA: Insufficient documentation

## 2018-01-08 ENCOUNTER — Encounter (HOSPITAL_BASED_OUTPATIENT_CLINIC_OR_DEPARTMENT_OTHER): Payer: Medicare Other | Attending: Internal Medicine

## 2018-03-01 ENCOUNTER — Other Ambulatory Visit (HOSPITAL_BASED_OUTPATIENT_CLINIC_OR_DEPARTMENT_OTHER): Payer: Self-pay | Admitting: Internal Medicine

## 2018-03-12 ENCOUNTER — Other Ambulatory Visit (HOSPITAL_BASED_OUTPATIENT_CLINIC_OR_DEPARTMENT_OTHER): Payer: Self-pay | Admitting: Internal Medicine

## 2018-03-15 ENCOUNTER — Other Ambulatory Visit (HOSPITAL_BASED_OUTPATIENT_CLINIC_OR_DEPARTMENT_OTHER): Payer: Self-pay | Admitting: Internal Medicine

## 2018-09-27 ENCOUNTER — Other Ambulatory Visit: Payer: Self-pay | Admitting: Orthopedic Surgery

## 2018-09-27 DIAGNOSIS — M25521 Pain in right elbow: Secondary | ICD-10-CM

## 2018-10-25 IMAGING — CT CT TIBIA FIBULA *R* W/O CM
2 of 4 series · 11 of 36 positions shown, 13 images · non-contrast
Comparison: Right knee radiograph dated 05/02/2017 and CT dated
02/07/2017

CLINICAL DATA: 72-year-old female with persistent wound inferior to
the right knee and poor wound healing. Concern for osteomyelitis.

EXAM:
CT OF THE right KNEE WITHOUT CONTRAST
TECHNIQUE: Multidetector CT imaging of the right knee was performed according
to the standard protocol. Multiplanar CT image reconstructions were
also generated.

[Series 4: axial st · axial · 0.45mm/px · z∈[-1367,-992]mm · 8 of 296 slices shown, 10 images]
[im 23/296  soft-tissue]
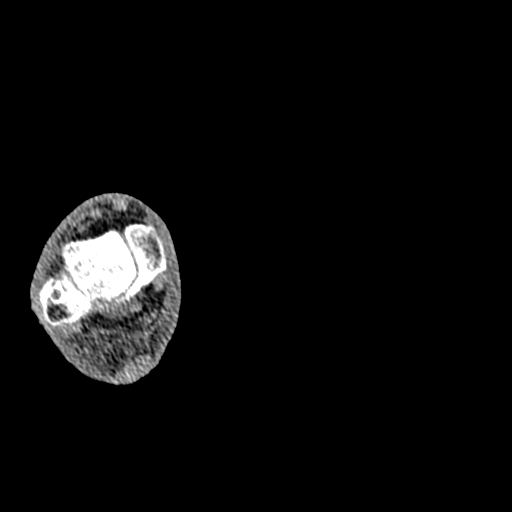
[im 23/296  bone]
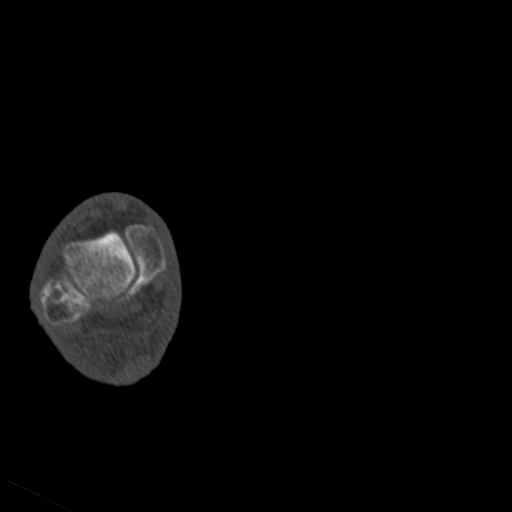
[im 69/296  bone]
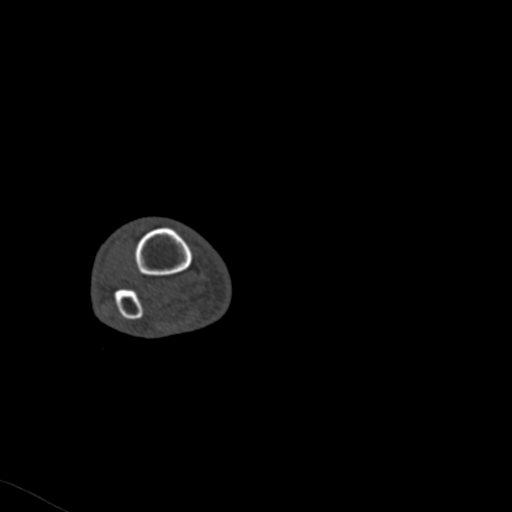
[im 91/296  bone]
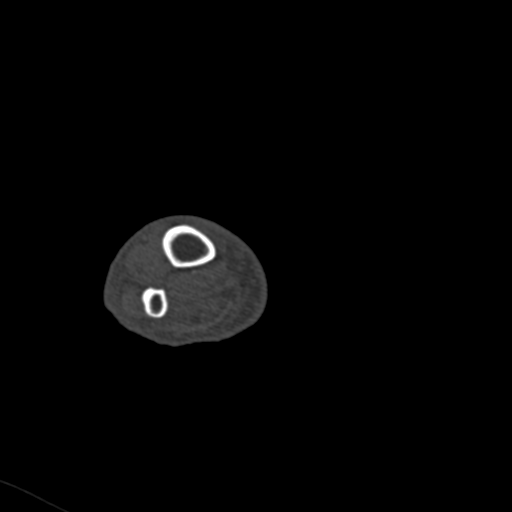
[im 137/296  bone]
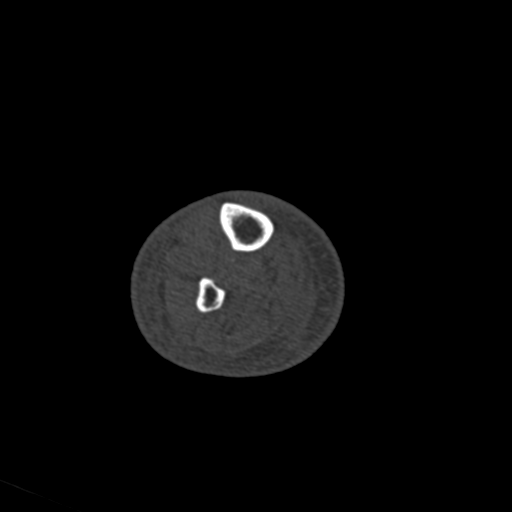
[im 159/296  soft-tissue]
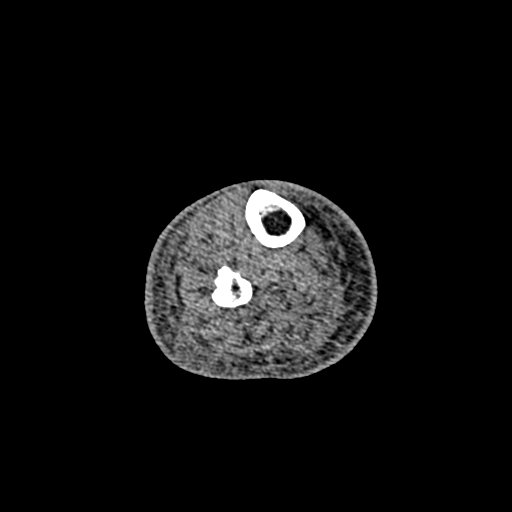
[im 159/296  bone]
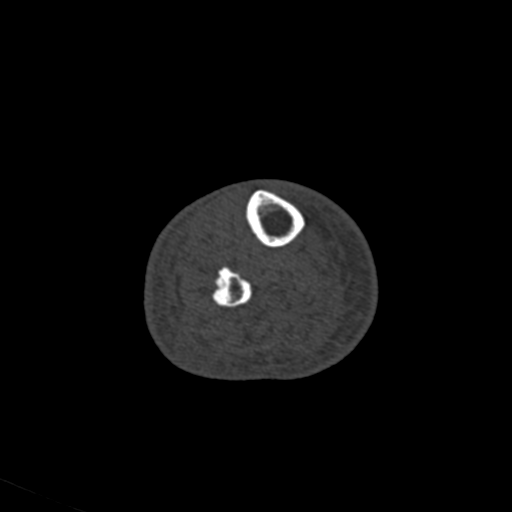
[im 205/296  bone]
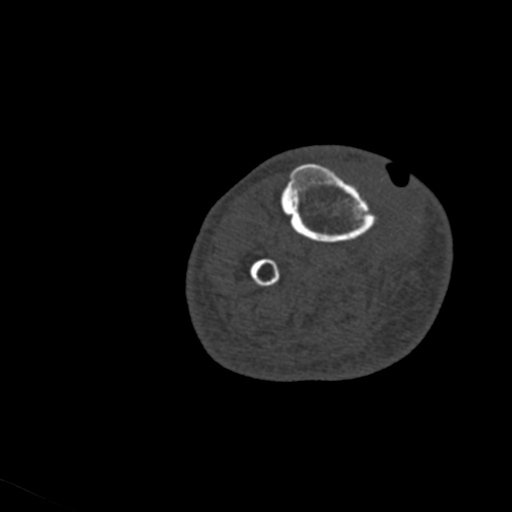
[im 227/296  bone]
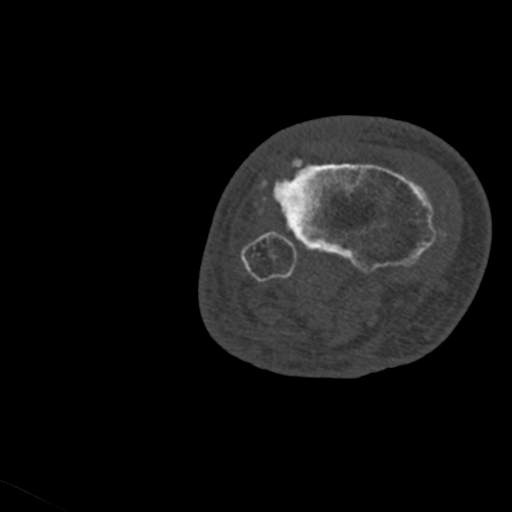
[im 273/296  bone]
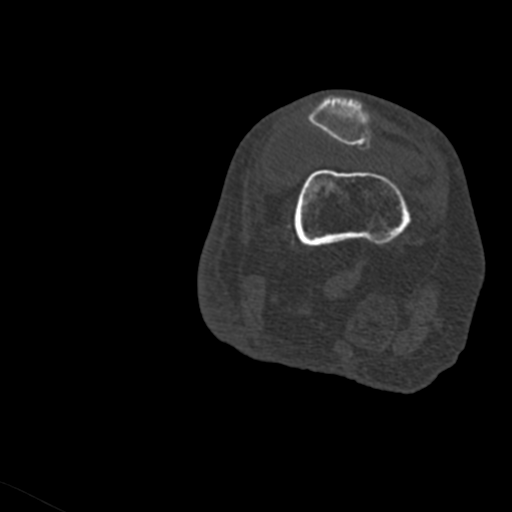

[Series 5: coronal bone · coronal · 0.31mm/px · 3 of 86 slices shown]
[im 18/86  bone]
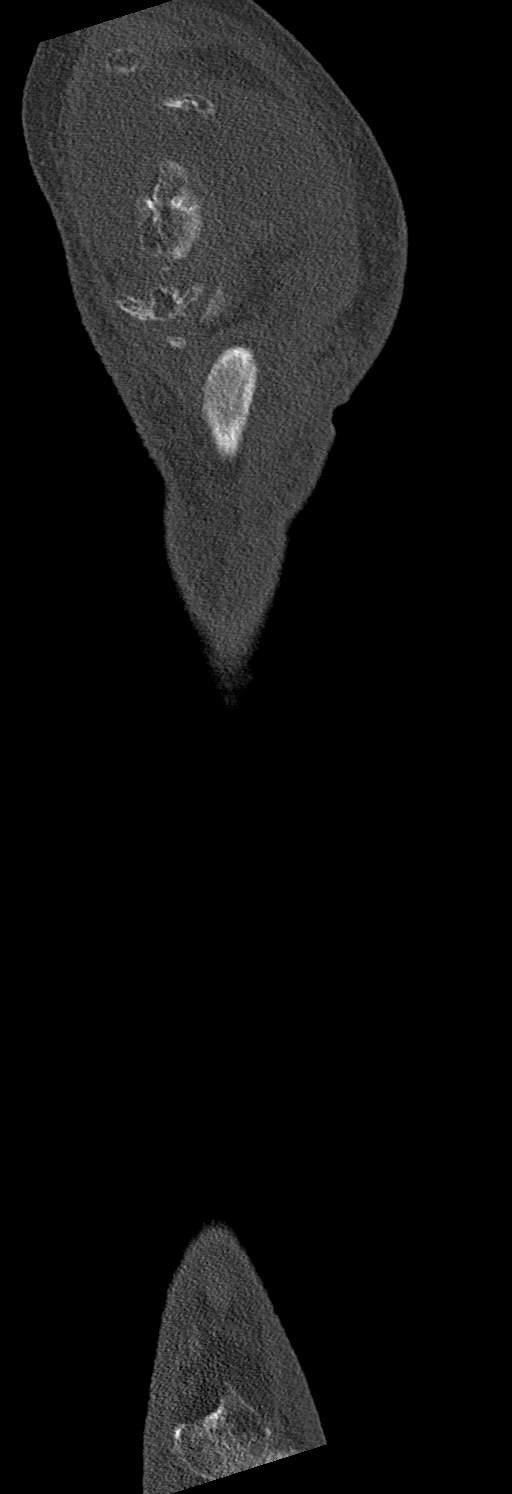
[im 35/86  bone]
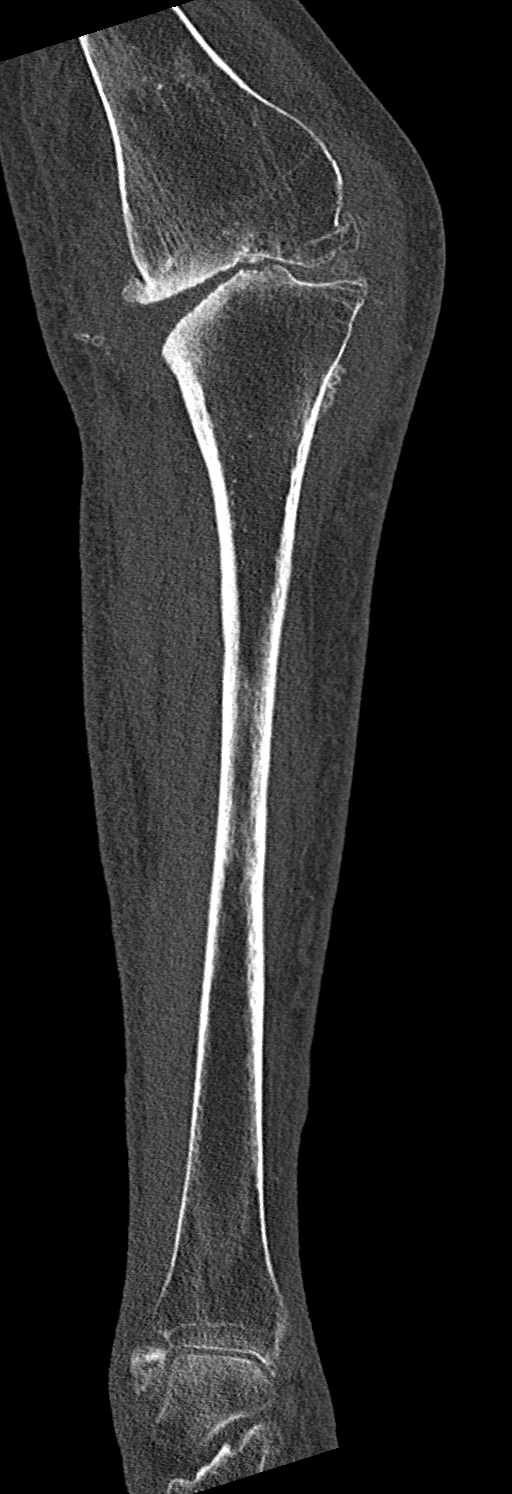
[im 52/86  bone]
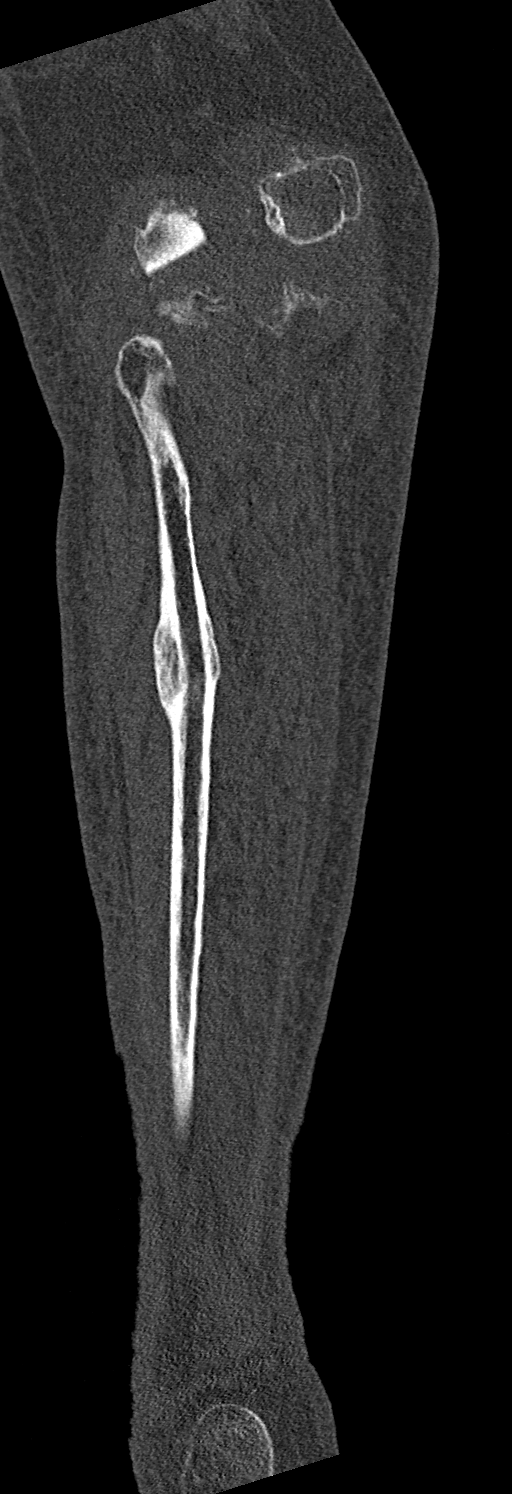

[11 of 36 positions shown; findings below may reference images not displayed]

FINDINGS: Evaluation of this exam is limited in the absence of intravenous
contrast.

Bones/Joint/Cartilage

There is no acute fracture or dislocation. Old healed fracture of
the proximal third of the fibular diaphysis. No periosteal
elevation. Severe arthritic changes of the knee with sclerotic
changes involving the lateral compartment along the lateral tibial
plateau and femoral condyle. There is valgus tilt of the knee as
seen on the prior CT. There is multiple osteophytes and bone spur.
There is diffuse thickening of the synovium and knee capsule with
moderate amount of joint effusion which is concerning for an
infectious process, possibly chronic or an inflammatory synovitis.
Correlation with clinical exam and joint fluid analysis recommended.
There is thickening of the soft tissues inferior to the medial knee
with a focal area of skin defect consistent with known ulcer/wound.

Ligaments

Suboptimally assessed by CT.

Muscles and Tendons

No intramuscular gas or fluid collection.

Soft tissues

Diffuse subcutaneous edema and subcutaneous soft tissue thickening
and skin ulcer inferior to the medial knee.
IMPRESSION: 1. Diffuse synovial thickening with moderate joint effusion as seen
on the prior CT consistent with an infectious or inflammatory
synovitis. Correlation with clinical exam and joint fluid analysis
recommended.
2. Severe arthropathy of the knee with valgus tilt similar to prior
CT.
3. Skin wound inferior to the medial knee.
4. No acute fracture or dislocation.  Osteopenia.

## 2018-10-28 ENCOUNTER — Ambulatory Visit
Admission: RE | Admit: 2018-10-28 | Discharge: 2018-10-28 | Disposition: A | Discharge status: Home / Self Care | Payer: Medicare Other | Source: Ambulatory Visit | Attending: Orthopedic Surgery | Admitting: Orthopedic Surgery

## 2018-10-28 ENCOUNTER — Other Ambulatory Visit: Payer: Self-pay

## 2018-10-28 DIAGNOSIS — M25521 Pain in right elbow: Secondary | ICD-10-CM

## 2021-04-25 ENCOUNTER — Observation Stay (HOSPITAL_COMMUNITY)
Admission: EM | Admit: 2021-04-25 | Discharge: 2021-04-27 | Disposition: A | Discharge status: Home Health | Payer: Medicare Other | Attending: Internal Medicine | Admitting: Internal Medicine

## 2021-04-25 ENCOUNTER — Emergency Department (HOSPITAL_COMMUNITY): Payer: Medicare Other

## 2021-04-25 ENCOUNTER — Encounter (HOSPITAL_COMMUNITY): Payer: Self-pay

## 2021-04-25 ENCOUNTER — Other Ambulatory Visit: Payer: Self-pay

## 2021-04-25 DIAGNOSIS — Z8673 Personal history of transient ischemic attack (TIA), and cerebral infarction without residual deficits: Secondary | ICD-10-CM | POA: Diagnosis not present

## 2021-04-25 DIAGNOSIS — R7303 Prediabetes: Secondary | ICD-10-CM | POA: Insufficient documentation

## 2021-04-25 DIAGNOSIS — J45909 Unspecified asthma, uncomplicated: Secondary | ICD-10-CM | POA: Insufficient documentation

## 2021-04-25 DIAGNOSIS — N1831 Chronic kidney disease, stage 3a: Secondary | ICD-10-CM | POA: Diagnosis not present

## 2021-04-25 DIAGNOSIS — I129 Hypertensive chronic kidney disease with stage 1 through stage 4 chronic kidney disease, or unspecified chronic kidney disease: Secondary | ICD-10-CM | POA: Diagnosis not present

## 2021-04-25 DIAGNOSIS — F0390 Unspecified dementia without behavioral disturbance: Secondary | ICD-10-CM | POA: Diagnosis not present

## 2021-04-25 DIAGNOSIS — Z91040 Latex allergy status: Secondary | ICD-10-CM | POA: Insufficient documentation

## 2021-04-25 DIAGNOSIS — Z993 Dependence on wheelchair: Secondary | ICD-10-CM

## 2021-04-25 DIAGNOSIS — I161 Hypertensive emergency: Principal | ICD-10-CM | POA: Insufficient documentation

## 2021-04-25 DIAGNOSIS — E440 Moderate protein-calorie malnutrition: Secondary | ICD-10-CM | POA: Diagnosis not present

## 2021-04-25 DIAGNOSIS — R519 Headache, unspecified: Secondary | ICD-10-CM | POA: Diagnosis present

## 2021-04-25 DIAGNOSIS — N183 Chronic kidney disease, stage 3 unspecified: Secondary | ICD-10-CM | POA: Insufficient documentation

## 2021-04-25 DIAGNOSIS — Z79899 Other long term (current) drug therapy: Secondary | ICD-10-CM | POA: Diagnosis not present

## 2021-04-25 DIAGNOSIS — Z91010 Allergy to peanuts: Secondary | ICD-10-CM | POA: Insufficient documentation

## 2021-04-25 DIAGNOSIS — Z79891 Long term (current) use of opiate analgesic: Secondary | ICD-10-CM

## 2021-04-25 DIAGNOSIS — M5136 Other intervertebral disc degeneration, lumbar region: Secondary | ICD-10-CM | POA: Insufficient documentation

## 2021-04-25 DIAGNOSIS — K219 Gastro-esophageal reflux disease without esophagitis: Secondary | ICD-10-CM | POA: Insufficient documentation

## 2021-04-25 HISTORY — DX: Cutaneous abscess of right lower limb: L02.415

## 2021-04-25 HISTORY — DX: Cutaneous abscess of right lower limb: L03.115

## 2021-04-25 LAB — CBC WITH DIFFERENTIAL/PLATELET
Abs Immature Granulocytes: 0.04 10*3/uL (ref 0.00–0.07)
Basophils Absolute: 0.1 10*3/uL (ref 0.0–0.1)
Basophils Relative: 1 %
Eosinophils Absolute: 0.5 10*3/uL (ref 0.0–0.5)
Eosinophils Relative: 6 %
HCT: 34.3 % — ABNORMAL LOW (ref 36.0–46.0)
Hemoglobin: 9.9 g/dL — ABNORMAL LOW (ref 12.0–15.0)
Immature Granulocytes: 0 %
Lymphocytes Relative: 12 %
Lymphs Abs: 1.2 10*3/uL (ref 0.7–4.0)
MCH: 19.5 pg — ABNORMAL LOW (ref 26.0–34.0)
MCHC: 28.9 g/dL — ABNORMAL LOW (ref 30.0–36.0)
MCV: 67.7 fL — ABNORMAL LOW (ref 80.0–100.0)
Monocytes Absolute: 0.2 10*3/uL (ref 0.1–1.0)
Monocytes Relative: 2 %
Neutro Abs: 7.4 10*3/uL (ref 1.7–7.7)
Neutrophils Relative %: 79 %
Platelets: ADEQUATE 10*3/uL (ref 150–400)
RBC: 5.07 MIL/uL (ref 3.87–5.11)
RDW: 19.9 % — ABNORMAL HIGH (ref 11.5–15.5)
WBC: 9.3 10*3/uL (ref 4.0–10.5)
nRBC: 0 % (ref 0.0–0.2)

## 2021-04-25 LAB — COMPREHENSIVE METABOLIC PANEL
ALT: 14 U/L (ref 0–44)
AST: 21 U/L (ref 15–41)
Albumin: 4 g/dL (ref 3.5–5.0)
Alkaline Phosphatase: 69 U/L (ref 38–126)
Anion gap: 13 (ref 5–15)
BUN: 12 mg/dL (ref 8–23)
CO2: 22 mmol/L (ref 22–32)
Calcium: 9.8 mg/dL (ref 8.9–10.3)
Chloride: 103 mmol/L (ref 98–111)
Creatinine, Ser: 1.09 mg/dL — ABNORMAL HIGH (ref 0.44–1.00)
GFR, Estimated: 53 mL/min — ABNORMAL LOW (ref >60)
Glucose, Bld: 106 mg/dL — ABNORMAL HIGH (ref 70–99)
Potassium: 3.7 mmol/L (ref 3.5–5.1)
Sodium: 138 mmol/L (ref 135–145)
Total Bilirubin: 0.6 mg/dL (ref 0.3–1.2)
Total Protein: 7.1 g/dL (ref 6.5–8.1)

## 2021-04-25 LAB — CBG MONITORING, ED: Glucose-Capillary: 88 mg/dL (ref 70–99)

## 2021-04-25 LAB — RAPID URINE DRUG SCREEN, HOSP PERFORMED
Amphetamines: NOT DETECTED
Barbiturates: NOT DETECTED
Benzodiazepines: NOT DETECTED
Cocaine: NOT DETECTED
Opiates: POSITIVE — AB
Tetrahydrocannabinol: NOT DETECTED

## 2021-04-25 LAB — TROPONIN I (HIGH SENSITIVITY)
Troponin I (High Sensitivity): 18 ng/L — ABNORMAL HIGH (ref <18)
Troponin I (High Sensitivity): 17 ng/L (ref <18)

## 2021-04-25 LAB — PHOSPHORUS: Phosphorus: 3.4 mg/dL (ref 2.5–4.6)

## 2021-04-25 LAB — MAGNESIUM: Magnesium: 1.9 mg/dL (ref 1.7–2.4)

## 2021-04-25 MED ORDER — CLEVIDIPINE BUTYRATE 0.5 MG/ML IV EMUL
0.0000 mg/h | INTRAVENOUS | Status: DC
  Administered 2021-04-25: 1 mg/h via INTRAVENOUS
  Filled 2021-04-25: qty 50

## 2021-04-25 MED ORDER — HYDROCODONE-ACETAMINOPHEN 10-325 MG PO TABS
1.0000 | ORAL_TABLET | Freq: Four times a day (QID) | ORAL | Status: DC | PRN
  Administered 2021-04-26 – 2021-04-27 (×3): 1 via ORAL
  Filled 2021-04-25 (×3): qty 1

## 2021-04-25 MED ORDER — CLONIDINE HCL 0.2 MG PO TABS
0.3000 mg | ORAL_TABLET | Freq: Once | ORAL | Status: AC
  Administered 2021-04-25: 0.3 mg via ORAL
  Filled 2021-04-25: qty 1

## 2021-04-25 MED ORDER — LABETALOL HCL 5 MG/ML IV SOLN: 80.0000 mg | INTRAVENOUS | Status: DC | PRN

## 2021-04-25 MED ORDER — LABETALOL HCL 5 MG/ML IV SOLN
20.0000 mg | Freq: Once | INTRAVENOUS | Status: AC
  Administered 2021-04-25: 20 mg via INTRAVENOUS
  Filled 2021-04-25: qty 4

## 2021-04-25 MED ORDER — NIFEDIPINE 10 MG PO CAPS
10.0000 mg | ORAL_CAPSULE | Freq: Once | ORAL | Status: AC
  Administered 2021-04-26: 10 mg via ORAL
  Filled 2021-04-25: qty 1

## 2021-04-25 MED ORDER — HYDROMORPHONE HCL 1 MG/ML IJ SOLN
0.5000 mg | Freq: Once | INTRAMUSCULAR | Status: AC
  Administered 2021-04-25: 0.5 mg via INTRAVENOUS
  Filled 2021-04-25: qty 1

## 2021-04-25 MED ORDER — CLONIDINE HCL 0.2 MG/24HR TD PTWK
0.2000 mg | MEDICATED_PATCH | TRANSDERMAL | Status: DC
  Filled 2021-04-25: qty 1

## 2021-04-25 MED ORDER — HYDRALAZINE HCL 20 MG/ML IJ SOLN: 20.0000 mg | Freq: Four times a day (QID) | INTRAMUSCULAR | Status: DC | PRN

## 2021-04-25 MED ORDER — HYDRALAZINE HCL 20 MG/ML IJ SOLN
10.0000 mg | Freq: Once | INTRAMUSCULAR | Status: DC
  Filled 2021-04-25: qty 1

## 2021-04-25 MED ORDER — LABETALOL HCL 5 MG/ML IV SOLN: 40.0000 mg | INTRAVENOUS | Status: DC | PRN

## 2021-04-25 MED ORDER — LABETALOL HCL 5 MG/ML IV SOLN: 10.0000 mg | INTRAVENOUS | Status: DC | PRN

## 2021-04-25 NOTE — Progress Notes (Signed)
? ?  Discussed with PCCM. Pt still hypertensive and tachycardic. Just had cleviprex stopped and po clonidine given. HR 110, BP 184-107.  ? ?Still not stable hemodynamics. PCCM will order po and prn IV meds. Will wait 1 hour and see if these meds work and assess stability for floor status. ? ?Updated EDP on plan. ? ?Kristopher Oppenheim, DO ? ? ? ? ? ?Kristopher Oppenheim, DO ?[facility] ? ?

## 2021-04-25 NOTE — Consult Note (Signed)
? ?NAME:  Tamara Howe, MRN:  [id number], DOB:  11-Apr-1945, LOS: 0 ?ADMISSION DATE:  04/25/2021 CONSULTATION DATE:  04/25/2021 ?REFERRING MD:  Kathrynn Humble - EDP CHIEF COMPLAINT:  Hypertension, c/f PRES  ? ?History of Present Illness:  ?76 year old woman who presented to [facility] ED 3/20 for AMS/HA and hypertensive urgency. Presented to [facility] 3/20 for routine spinal injection for chronic back pain ([place] 1.5%, Bupivacaine 0.25%, Dexamethasone '30mg'$ ) and subsequently became less responsive and hypertensive to 200s. ? ?On presentation to ED, vitals were notable for tachycardia (103), BP 183/112, O2 sat 94%, normothermia. She was mildly altered on arrival, but confusion continued to improve.  Reported bilateral leg pain, which is present at baseline but worse today.  Denies fever, chills, CP/SOB.  Reports headache.  Denies dizziness/lightheadedness now, but did experience this at the spine clinic.  There was some concern for clonidine patch removal without replacement, but is currently noted to be in place on posterior left upper extremity. ? ?PCCM consulted for hypertensive urgency and possible need for ICU for antihypertensive gtt. ? ?Pertinent Medical History:  ? ?Past Medical History:  ?Diagnosis Date  ? Abdominal mass, LLQ (left lower quadrant)   ? Anemia   ? Anxiety   ? Asthma   ? Bladder infection, chronic   ? Cellulitis and abscess of leg 02/08/2017  ? Chest pain   ? Chronic pain   ? Complication of anesthesia   ? "stopped breathing with tumor removal surgery, not happened since then"  ? Constipation   ? Cough   ? Depression   ? Dietary counseling   ? Dysuria   ? Edema extremities   ? Enthesopathy of knee   ? Esophagitis   ? GERD (gastroesophageal reflux disease)   ? Headache   ? Heart murmur   ? High risk medication use   ? History of bronchitis   ? Hypercholesterolemia   ? Hypertension   ? Insomnia   ? Ischemic cerebrovascular accident (CVA) of frontal lobe ([place])   ? Lipoma of skin   ? Low back  pain   ? Malaise and fatigue   ? Nocturia   ? Noncompliance with medications   ? Osteoarthritis   ? PONV (postoperative nausea and vomiting)   ? Prediabetes   ? Rash   ? Shortness of breath dyspnea   ? Vitamin D deficiency   ? ?Significant Hospital Events: ?Including procedures, antibiotic start and stop dates in addition to other pertinent events   ?3/20 - Presented to [facility] ED for hypertensive urgency and mild AMS s/p spinal injection at her spine/pain clinic. Cleviprex initiated. ?Clonidine patch not changed. PO antihypertensives resumed including clonidine. Cleviprex d/c'ed. ? ?Interim History / Subjective:  ?PCCM consulted for hypertensive urgency ? ?Objective:  ?Blood pressure (!) 157/94, pulse (!) 115, temperature 98.7 ?F (37.1 ?C), resp. rate (!) 21, height '5\' 3"'$  (1.6 m), weight 54.4 kg, SpO2 94 %. ?   ?   ?No intake or output data in the 24 hours ending 04/25/21 2231 ?Filed Weights  ? 04/25/21 1901  ?Weight: 54.4 kg  ? ?Physical Examination: ?General: Chronically ill-appearing elderly woman in NAD. Sitting up in bed going through home medications. ?HEENT: [place]/AT, anicteric sclera, PERRL, moist mucous membranes. ?Neuro: Awake, oriented x 4. Responds to verbal stimuli. Following commands consistently. Moves all 4 extremities spontaneously. ?CV: RRR, III/VI systolic murmur noted. ?PULM: Breathing even and unlabored on RA. Lung fields CTAB anteriorly. ?GI: Soft, mild generalized TTP, nondistended. Normoactive bowel sounds. ?Extremities: Bilateral  1-2+ LE edema noted. Tourniquetting/indentation noted from knee-high pantyhose. Chronic bilateral anterior tibial edema and erythema, non-blanching. Grossly painful with palpation. L hand with bandage in place from skin tear. ?Skin: Warm/very dry and scaly, LE erythema as above. ? ?Resolved Hospital Problem List:  ? ? ?Assessment & Plan:  ?Hypertensive urgency ?History of hypertension ?Presented to [facility] ED 3/20 for hypertensive urgency and mild AMS s/p spinal injection at  her spine/pain clinic. Cleviprex initiated. ?Clonidine patch not changed. PO antihypertensives resumed including clonidine. Cleviprex d/c'ed. ?- BP significantly improved with administration of PO clonidine and pain control ?- Cleviprex gtt discontinued ?- Ok to admit to progressive vs. floor, no indication for ICU if remaining off of antihypertensive gtt ?- Goal SBP 140-160 ?- Clonidine patch ?- Resume home antihypertensives in stepwise manner ?- IV Hydralazine PRN ? ?AMS, resolved ?Headache ?Initial concern for PRES given AMS and hypertension, AMS improved significantly. CT 3/20 with NAICA. ?- BP control as above ?- No indication for further brain imaging at present ?- APAP PRN for HA ? 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 C/o bilateral leg pain on admission with knee-high pantyhose in place (removed in ED). ?- Dilaudid 0.'5mg'$  IV x 1 given in ED ?- [place] resumed (10-325) ?- WOC consult ? ?Physical debility ?- PT/OT consults indicated ? ?Best Practice: (right click and "Reselect all SmartList Selections" daily)  ? ?Per Primary Team ? ?Labs:  ?CBC: ?Recent Labs  ?Lab 04/25/21 ?1802  ?WBC 9.3  ?NEUTROABS 7.4  ?HGB 9.9*  ?HCT 34.3*  ?MCV 67.7*  ?PLT PLATELET CLUMPS NOTED ON SMEAR, COUNT APPEARS ADEQUATE  ? ?Basic Metabolic Panel: ?Recent Labs  ?Lab 04/25/21 ?1802  ?NA 138  ?K 3.7  ?CL 103  ?CO2 22  ?GLUCOSE 106*  ?BUN 12  ?CREATININE 1.09*  ?CALCIUM 9.8  ?MG 1.9  ?PHOS 3.4  ? ?GFR: ?Estimated Creatinine Clearance: 36.9 mL/min (A) (by C-G formula based on SCr of 1.09 mg/dL (H)). ?Recent Labs  ?Lab 04/25/21 ?1802  ?WBC 9.3  ? ?Liver Function Tests: ?Recent Labs  ?Lab 04/25/21 ?1802  ?AST 21  ?ALT 14  ?ALKPHOS 69  ?BILITOT 0.6  ?PROT 7.1  ?ALBUMIN 4.0  ? ?No results for input(s): LIPASE, AMYLASE in the last 168 hours. ?No results for input(s): AMMONIA in the last 168 hours. ? ?ABG: ?No results found for: PHART, PCO2ART, PO2ART, HCO3, TCO2,  ACIDBASEDEF, O2SAT  ? ?Coagulation Profile: ?No results for input(s): INR, PROTIME in the last 168 hours. ? ?Cardiac Enzymes: ?No results for input(s): CKTOTAL, CKMB, CKMBINDEX, TROPONINI in the last 168 hours. ? ?HbA1C: ?No results found for: HGBA1C ? ?CBG: ?Recent Labs  ?Lab 04/25/21 ?1752  ?GLUCAP 88  ? ?Review of Systems:   ?Review of systems completed with pertinent positives/negatives outlined in above HPI. ? 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  ? ?Surgical History:  ? ?Past Surgical History:  ?  Procedure Laterality Date  ? ABDOMINAL HYSTERECTOMY    ? ANKLE SURGERY Right   ? APPENDECTOMY    ? CARDIAC CATHETERIZATION    ? 12/13/12 [facility], Dr. Gibson Ramp): 30-40% mid LAD.  ? SALIVARY GLAND SURGERY    ? TUMOR EXCISION    ?  ?Social History:  ? reports that she has never smoked. She has never used smokeless tobacco. She reports that she does not drink alcohol and does not use drugs.  ? ?Family History:  ?Her family history includes Asthma in her mother; Diabetes Mellitus II in her father and sister; Hypertension in her father.  ? ?Allergies: ?Allergies  ?Allergen Reactions  ? Aspirin Shortness Of Breath  ?  respiratory distress  ? Codeine Nausea Only   ? Peanut (Diagnostic) Anaphylaxis  ?  hives and wheezing, anaphylaxis  ? Penicillins Anaphylaxis  ?  Has patient had a PCN reaction causing immediate rash, facial/tongue/throat swelling, SOB or lighthea

## 2021-04-25 NOTE — ED Triage Notes (Signed)
Pt bib ems from [facility].  Pt received Lidocaine 1.5%, Bupivacaine 0.25% and Dexamethasone 30 mg injections. After the pt received the injections she became weak and barely responded to staff. Pt BP was noted to be hypertensive 212/110; 222/110; 200/102; and 234/124. ? ?CBG 100 ?

## 2021-04-25 NOTE — ED Provider Notes (Signed)
?[facility] ?Provider Note ? ? ?CSN: [id number] ?Arrival date & time: 04/25/21  1745 ? ?  ? ?History ? ?No chief complaint on file. ? ? ?Tamara Howe is a 76 y.o. female w/ HTN, chronic right lower extremity wound, cellulitis, chronic pain, anemia, prior BLE cellulitis presenting from the neurology clinic due to hypertension and sleepiness.  Patient was there receiving her normal shot for her back pain which includes dexamethasone, lidocaine with epi, and bupivacaine.  Shortly after patient received the shot she became very sleepy and was noted to be hypertensive.  EMS reports there has been recent changes in her blood pressure medication including a possible clonidine patch change.  At baseline per husband she is not able to tell you the the date and is somewhat confused at baseline.  Patient is able to tell me her name.  She also tells me she does not walk.  She tells me she feels sleepy.  Denies any pain initially.  On reevaluation, when she was more alert, reports that her legs hurt and she has a mild headache with some dizziness. ? ?Husband arrived and reports he likely did not replace her clonidine patch last Friday.  He cannot remember doing it. ? ?HPI ? ?  ? ?Home Medications ?Prior to Admission medications   ?Medication Sig Start Date End Date Taking? Authorizing Provider  ?acetaminophen (TYLENOL) 500 MG tablet Take 1,000 mg by mouth every 6 (six) hours as needed for moderate pain.    [provider]  ?albuterol (PROVENTIL HFA) 108 (90 Base) MCG/ACT inhaler Inhale 2 puffs into the lungs every 6 (six) hours as needed for wheezing or shortness of breath.    [provider]  ?albuterol (PROVENTIL) (2.5 MG/3ML) 0.083% nebulizer solution Take 2.5 mg by nebulization every 6 (six) hours as needed for wheezing or shortness of breath.    [provider]  ?atorvastatin (LIPITOR) 40 MG tablet Take 40 mg by mouth daily.    [provider]   ?chlorthalidone (HYGROTON) 25 MG tablet Take 25 mg by mouth daily.    [provider]  ?clonazePAM (KLONOPIN) 0.5 MG tablet Take 0.5 mg by mouth 2 (two) times daily as needed for anxiety.    [provider]  ?cloNIDine (CATAPRES - DOSED IN MG/24 HR) 0.2 mg/24hr patch Place 0.2 mg onto the skin once a week.    [provider]  ?divalproex (DEPAKOTE ER) 500 MG 24 hr tablet Take 500 mg by mouth daily.    [provider]  ?EPINEPHrine (EPIPEN 2-PAK) 0.3 mg/0.3 mL IJ SOAJ injection Inject 0.3 mg into the muscle once. For anaphylaxis    [provider]  ?hydrALAZINE (APRESOLINE) 50 MG tablet Take 75 mg by mouth 3 (three) times daily.    [provider]  ?HYDROcodone-acetaminophen (NORCO) 10-325 MG tablet Take 1 tablet by mouth every 6 (six) hours as needed for moderate pain.     [provider]  ?metoprolol tartrate (LOPRESSOR) 50 MG tablet Take 1 tablet (50 mg total) by mouth 2 (two) times daily. 02/11/17   Yaakov Guthrie, MD  ?NIFEdipine (PROCARDIA-XL/ADALAT CC) 60 MG 24 hr tablet Take 60 mg by mouth daily.    [provider]  ?potassium chloride SA (K-DUR,KLOR-CON) 20 MEQ tablet Take 1 tablet (20 mEq total) by mouth daily. Take only when taking the Lasix 02/11/17   Matcha, Anupama, MD  ?spironolactone (ALDACTONE) 25 MG tablet Take 25 mg by mouth daily.    [provider]  ?Vitamin D, Ergocalciferol, (DRISDOL) 50000 units CAPS capsule Take 50,000 Units by mouth every Monday, Wednesday, and Friday.     [provider]  ?   ? ?Allergies    ?Aspirin, Codeine, Peanut (diagnostic), Penicillins, Shellfish allergy, Tetracycline, Cephalexin, Iodinated contrast media, Latex, Morphine, Nitrofuran derivatives, and Sulfa antibiotics   ? ?Review of Systems   ?Review of Systems  ?Constitutional:   ?     Sleepiness  ?Psychiatric/Behavioral:  Positive for confusion.   ? ?Physical Exam ?Updated Vital Signs ?BP (!) 183/112   Pulse (!) 103   Temp  98.7 ?F (37.1 ?C)   Resp 17   Ht '5\' 3"'$  (1.6 m)   Wt 54.4 kg   SpO2 94%   BMI 21.26 kg/m?  ?Physical Exam ?Constitutional:   ?   General: She is not in acute distress. ?   Comments: Somnolent  ?HENT:  ?   Head: Normocephalic and atraumatic.  ?Eyes:  ?   Extraocular Movements: Extraocular movements intact.  ?   Pupils: Pupils are equal, round, and reactive to light.  ?Pulmonary:  ?   Effort: Pulmonary effort is normal. No respiratory distress.  ?Abdominal:  ?   General: There is no distension.  ?   Tenderness: There is no abdominal tenderness. There is no guarding or rebound.  ?Musculoskeletal:  ?   Comments: Deformity to right lower extremity at the knee ?Bilateral lower extremity edema with overlying erythema  ?Neurological:  ?   Cranial Nerves: No cranial nerve deficit.  ?   Sensory: No sensory deficit.  ?   Comments: Able to lift bilateral lower extremities barely off the bed against gravity.  Able to lift bilateral upper arms.  Handgrip slightly decreased on the right versus left.  Strength in right upper extremity slightly decreased compared to the left but able to lift both up against gravity.  ? ? ?ED Results / Procedures / Treatments   ?Labs ?(all labs ordered are listed, but only abnormal results are displayed) ?Labs Reviewed  ?CBC WITH DIFFERENTIAL/PLATELET - Abnormal; Notable for the following components:  ?    Result Value  ? Hemoglobin 9.9 (*)   ? HCT 34.3 (*)   ? MCV 67.7 (*)   ? MCH 19.5 (*)   ? MCHC 28.9 (*)   ? RDW 19.9 (*)   ? All other components within normal limits  ?COMPREHENSIVE METABOLIC PANEL - Abnormal; Notable for the following components:  ? Glucose, Bld 106 (*)   ? Creatinine, Ser 1.09 (*)   ? GFR, Estimated 53 (*)   ? All other components within normal limits  ?TROPONIN I (HIGH SENSITIVITY) - Abnormal; Notable for the following components:  ? Troponin I (High Sensitivity) 18 (*)   ? All other components within normal limits  ?MAGNESIUM  ?PHOSPHORUS  ?RAPID URINE DRUG SCREEN, HOSP  PERFORMED  ?CBG MONITORING, ED  ?TROPONIN I (HIGH SENSITIVITY)  ? ? ?EKG ?EKG Interpretation ? ?Date/Time:  Monday April 25 2021 17:56:06 EDT ?Ventricular Rate:  117 ?PR Interval:  180 ?QRS Duration: 95 ?QT Interval:  319 ?QTC Calculation: 445 ?R Axis:   -34 ?Text Interpretation: Sinus tachycardia Atrial premature complex Probable left atrial enlargement Left axis deviation Anterior infarct, old Minimal ST elevation, lateral leads tachycardia is new Confirmed by Varney Biles [id number]) on 04/25/2021 6:21:58 PM ? ?Radiology ?CT Head Wo Contrast ? ?Result Date: 04/25/2021 ?CLINICAL DATA:  Altered mental status. EXAM: CT HEAD WITHOUT CONTRAST TECHNIQUE: Contiguous axial images were obtained from the  base of the skull through the vertex without intravenous contrast. RADIATION DOSE REDUCTION: This exam was performed according to the departmental dose-optimization program which includes automated exposure control, adjustment of the mA and/or kV according to patient size and/or use of iterative reconstruction technique. COMPARISON:  None. FINDINGS: Brain: Mild chronic ischemic white matter disease is noted. No mass effect or midline shift is noted. Ventricular size is within normal limits. There is no evidence of mass lesion, hemorrhage or acute infarction. Vascular: No hyperdense vessel or unexpected calcification. Skull: Normal. Negative for fracture or focal lesion. Sinuses/Orbits: No acute finding. Other: None. IMPRESSION: No acute intracranial abnormality seen Electronically Signed   By: Marijo Conception M.D.   On: 04/25/2021 19:41  ? ?DG Chest Portable 1 View ? ?Result Date: 04/25/2021 ?CLINICAL DATA:  Somnolence EXAM: PORTABLE CHEST 1 VIEW COMPARISON:  07/20/2015 FINDINGS: Transverse diameter of heart is increased. There are no signs of pulmonary edema or focal pulmonary consolidation. Right lateral CP angle is not included in the image. There is no significant pleural effusion or pneumothorax. Left hemidiaphragm is  elevated. This may be due to eventration or paralysis. IMPRESSION: Cardiomegaly. There are no signs of pulmonary edema or new focal infiltrates. Electronically Signed   By: Elmer Picker M.D.   On: 04/25/2021 18:47

## 2021-04-26 DIAGNOSIS — N1831 Chronic kidney disease, stage 3a: Secondary | ICD-10-CM

## 2021-04-26 DIAGNOSIS — Z993 Dependence on wheelchair: Secondary | ICD-10-CM

## 2021-04-26 DIAGNOSIS — I161 Hypertensive emergency: Secondary | ICD-10-CM | POA: Diagnosis not present

## 2021-04-26 DIAGNOSIS — Z79891 Long term (current) use of opiate analgesic: Secondary | ICD-10-CM

## 2021-04-26 DIAGNOSIS — I10 Essential (primary) hypertension: Secondary | ICD-10-CM | POA: Diagnosis not present

## 2021-04-26 LAB — COMPREHENSIVE METABOLIC PANEL
ALT: 14 U/L (ref 0–44)
AST: 24 U/L (ref 15–41)
Albumin: 3 g/dL — ABNORMAL LOW (ref 3.5–5.0)
Alkaline Phosphatase: 49 U/L (ref 38–126)
Anion gap: 12 (ref 5–15)
BUN: 17 mg/dL (ref 8–23)
CO2: 20 mmol/L — ABNORMAL LOW (ref 22–32)
Calcium: 8.8 mg/dL — ABNORMAL LOW (ref 8.9–10.3)
Chloride: 103 mmol/L (ref 98–111)
Creatinine, Ser: 1.21 mg/dL — ABNORMAL HIGH (ref 0.44–1.00)
GFR, Estimated: 47 mL/min — ABNORMAL LOW (ref >60)
Glucose, Bld: 181 mg/dL — ABNORMAL HIGH (ref 70–99)
Potassium: 3.9 mmol/L (ref 3.5–5.1)
Sodium: 135 mmol/L (ref 135–145)
Total Bilirubin: 0.6 mg/dL (ref 0.3–1.2)
Total Protein: 5.3 g/dL — ABNORMAL LOW (ref 6.5–8.1)

## 2021-04-26 LAB — CBC WITH DIFFERENTIAL/PLATELET
Abs Immature Granulocytes: 0.02 10*3/uL (ref 0.00–0.07)
Basophils Absolute: 0 10*3/uL (ref 0.0–0.1)
Basophils Relative: 0 %
Eosinophils Absolute: 0 10*3/uL (ref 0.0–0.5)
Eosinophils Relative: 0 %
HCT: 28 % — ABNORMAL LOW (ref 36.0–46.0)
Hemoglobin: 8 g/dL — ABNORMAL LOW (ref 12.0–15.0)
Immature Granulocytes: 0 %
Lymphocytes Relative: 20 %
Lymphs Abs: 1.2 10*3/uL (ref 0.7–4.0)
MCH: 19.2 pg — ABNORMAL LOW (ref 26.0–34.0)
MCHC: 28.6 g/dL — ABNORMAL LOW (ref 30.0–36.0)
MCV: 67.1 fL — ABNORMAL LOW (ref 80.0–100.0)
Monocytes Absolute: 0.2 10*3/uL (ref 0.1–1.0)
Monocytes Relative: 3 %
Neutro Abs: 4.6 10*3/uL (ref 1.7–7.7)
Neutrophils Relative %: 77 %
Platelets: 311 10*3/uL (ref 150–400)
RBC: 4.17 MIL/uL (ref 3.87–5.11)
RDW: 19.7 % — ABNORMAL HIGH (ref 11.5–15.5)
WBC: 6 10*3/uL (ref 4.0–10.5)
nRBC: 0 % (ref 0.0–0.2)

## 2021-04-26 LAB — MAGNESIUM: Magnesium: 1.8 mg/dL (ref 1.7–2.4)

## 2021-04-26 MED ORDER — ROPINIROLE HCL 1 MG PO TABS
0.5000 mg | ORAL_TABLET | Freq: Every day | ORAL | Status: DC
  Administered 2021-04-26: 0.5 mg via ORAL
  Filled 2021-04-26: qty 1

## 2021-04-26 MED ORDER — METOPROLOL TARTRATE 25 MG PO TABS: 50.0000 mg | ORAL_TABLET | Freq: Two times a day (BID) | ORAL | Status: DC

## 2021-04-26 MED ORDER — ACETAMINOPHEN 650 MG RE SUPP: 650.0000 mg | Freq: Four times a day (QID) | RECTAL | Status: DC | PRN

## 2021-04-26 MED ORDER — ACETAMINOPHEN 325 MG PO TABS
650.0000 mg | ORAL_TABLET | Freq: Four times a day (QID) | ORAL | Status: DC | PRN
Start: 2021-04-26 — End: 2021-04-27
  Administered 2021-04-26: 650 mg via ORAL
  Filled 2021-04-26: qty 2

## 2021-04-26 MED ORDER — HYDRALAZINE HCL 50 MG PO TABS
75.0000 mg | ORAL_TABLET | Freq: Three times a day (TID) | ORAL | Status: DC
  Administered 2021-04-26 (×3): 75 mg via ORAL
  Filled 2021-04-26 (×2): qty 1
  Filled 2021-04-26: qty 3

## 2021-04-26 MED ORDER — ATORVASTATIN CALCIUM 40 MG PO TABS: 40.0000 mg | ORAL_TABLET | Freq: Every day | ORAL | Status: DC

## 2021-04-26 MED ORDER — DIVALPROEX SODIUM ER 500 MG PO TB24
500.0000 mg | ORAL_TABLET | Freq: Every day | ORAL | Status: DC
  Administered 2021-04-26 – 2021-04-27 (×2): 500 mg via ORAL
  Filled 2021-04-26 (×2): qty 1

## 2021-04-26 MED ORDER — AMLODIPINE BESYLATE 5 MG PO TABS
5.0000 mg | ORAL_TABLET | Freq: Every day | ORAL | Status: DC
  Administered 2021-04-26 – 2021-04-27 (×2): 5 mg via ORAL
  Filled 2021-04-26 (×2): qty 1

## 2021-04-26 MED ORDER — HEPARIN SODIUM (PORCINE) 5000 UNIT/ML IJ SOLN
5000.0000 [IU] | Freq: Three times a day (TID) | INTRAMUSCULAR | Status: DC
  Administered 2021-04-26 – 2021-04-27 (×4): 5000 [IU] via SUBCUTANEOUS
  Filled 2021-04-26 (×4): qty 1

## 2021-04-26 MED ORDER — CHLORTHALIDONE 25 MG PO TABS
25.0000 mg | ORAL_TABLET | Freq: Every day | ORAL | Status: DC
  Administered 2021-04-26: 25 mg via ORAL
  Filled 2021-04-26 (×2): qty 1

## 2021-04-26 MED ORDER — NIFEDIPINE ER OSMOTIC RELEASE 60 MG PO TB24: 60.0000 mg | ORAL_TABLET | Freq: Every day | ORAL | Status: DC

## 2021-04-26 MED ORDER — SPIRONOLACTONE 25 MG PO TABS
25.0000 mg | ORAL_TABLET | Freq: Every day | ORAL | Status: DC
  Administered 2021-04-26 – 2021-04-27 (×2): 25 mg via ORAL
  Filled 2021-04-26 (×2): qty 1

## 2021-04-26 MED ORDER — CLONIDINE HCL 0.2 MG/24HR TD PTWK
0.2000 mg | MEDICATED_PATCH | TRANSDERMAL | Status: DC
  Administered 2021-04-26: 0.2 mg via TRANSDERMAL
  Filled 2021-04-26: qty 1

## 2021-04-26 MED ORDER — ONDANSETRON HCL 4 MG PO TABS: 4.0000 mg | ORAL_TABLET | Freq: Four times a day (QID) | ORAL | Status: DC | PRN

## 2021-04-26 MED ORDER — CLONAZEPAM 0.5 MG PO TABS
0.5000 mg | ORAL_TABLET | Freq: Two times a day (BID) | ORAL | Status: DC | PRN
  Administered 2021-04-26 – 2021-04-27 (×2): 0.5 mg via ORAL
  Filled 2021-04-26 (×2): qty 1

## 2021-04-26 MED ORDER — BOOST / RESOURCE BREEZE PO LIQD CUSTOM
1.0000 | Freq: Three times a day (TID) | ORAL | Status: DC
Start: 2021-04-26 — End: 2021-04-27
  Administered 2021-04-26 – 2021-04-27 (×2): 1 via ORAL

## 2021-04-26 MED ORDER — CLONIDINE HCL 0.2 MG PO TABS
0.2000 mg | ORAL_TABLET | Freq: Three times a day (TID) | ORAL | Status: DC | PRN
  Filled 2021-04-26: qty 1

## 2021-04-26 MED ORDER — ONDANSETRON HCL 4 MG/2ML IJ SOLN: 4.0000 mg | Freq: Four times a day (QID) | INTRAMUSCULAR | Status: DC | PRN

## 2021-04-26 MED ORDER — CARVEDILOL 25 MG PO TABS
25.0000 mg | ORAL_TABLET | Freq: Two times a day (BID) | ORAL | Status: DC
  Administered 2021-04-26 – 2021-04-27 (×3): 25 mg via ORAL
  Filled 2021-04-26: qty 1
  Filled 2021-04-26: qty 2
  Filled 2021-04-26: qty 1

## 2021-04-26 NOTE — ED Notes (Signed)
Placed breakfast order ?

## 2021-04-26 NOTE — ED Notes (Signed)
Unable to get IV on pt x3, IV team placed ?

## 2021-04-26 NOTE — Assessment & Plan Note (Signed)
Chronic. 

## 2021-04-26 NOTE — Progress Notes (Addendum)
PROGRESS NOTE        PATIENT DETAILS Name: Tamara Howe Age: 76 y.o. Sex: female Date of Birth: 1945/12/04 Admit Date: 04/25/2021 Admitting Physician Carollee Herter, DO [id number], [facility]  Brief Summary: Patient is a 76 y.o.  female with history of resistant hypertension on multiple antihypertensives-presented to the ED on 3/20 from her neurosurgeons office after she was found to have uncontrolled hypertension and confusion.   Significant events: 3/20>> presented to ED for uncontrolled hypertension/confusion after getting epidural injections at the neurosurgical office.  Briefly started on Cleviprex infusion-but rapidly improved.  Admit to [facility] on 3/21.  Significant studies: 3/20>> CXR: No PNA 3/20>> CT head: No acute intracranial abnormality  Significant microbiology data:   Procedures:   Consults: PCCM  Subjective: No chest pain or shortness of breath.  Objective: Vitals: Blood pressure 138/89, pulse 88, temperature 98.7 F (37.1 C), resp. rate 14, height 5\' 3"  (1.6 m), weight 54.4 kg, SpO2 98 %.   Exam: Gen Exam:Alert awake-not in any distress HEENT:atraumatic, normocephalic Chest: B/L clear to auscultation anteriorly CVS:S1S2 regular Abdomen:soft non tender, non distended Extremities:no edema Neurology: Tamara bilateral lower extremity edema  skin: no rash  Pertinent Labs/Radiology: CBC Latest Ref Rng & Units 04/25/2021 05/05/2017 05/04/2017  WBC 4.0 - 10.5 K/uL 9.3 9.8 11.4(H)  Hemoglobin 12.0 - 15.0 g/dL 3.8(V) 5.6(E) 10.6(L)  Hematocrit 36.0 - 46.0 % 34.3(L) 31.3(L) 35.2(L)  Platelets 150 - 400 K/uL PLATELET CLUMPS NOTED ON SMEAR, COUNT APPEARS ADEQUATE 388 490(H)    Lab Results  Component Value Date   NA 138 04/25/2021   K 3.7 04/25/2021   CL 103 04/25/2021   CO2 22 04/25/2021      Assessment/Plan: Hypertensive emergency: Briefly required Cleviprex infusion-BP now better.  Has been transitioned to oral  antihypertensives-see below.  Resistant hypertension: Appears to be on at least 5 antihypertensives at home-both patient and spouse at bedside very confused on what medication she is exactly on.  Apparently her PCP and cardiologist have made numerous changes.  Patient has a bag at bedside with almost 20-25 bottles-of multiple medications-including ones she no longer takes.  Suspect due to confusion-she may not be taking her medications as prescribed.  I have asked the spouse to bring all of her medications from the house-the ED pharmacist was was in the room with me-once spouse brings all her medications-we will need to sort out which medication needs to be continued and which medication does not.  For now-would continue with current dosing of amlodipine, Coreg, chlorthalidone, clonidine and hydralazine.  I have discontinued Procardia as she is already on amlodipine.  Will need optimization of her regimen for consideration of safe discharge given above issues.  Chronic back pain/chronic right knee OA: Mostly wheelchair-bound-but able to take a few steps.  Gets epidural injections frequently-continue home regimen of narcotics.  CKD stage IIIa: At baseline-follow electrolytes periodically.  HLD: Continue statin  Restless leg syndrome: Continue Requip  Unclear why she is on Depakote-both spouse/patient think she is taking this medication-await medication reconciliation-see above.  BMI: Estimated body mass index is 21.26 kg/m as calculated from the following:   Height as of this encounter: 5\' 3"  (1.6 m).   Weight as of this encounter: 54.4 kg.   Code status:   Code Status: Full Code   DVT Prophylaxis: heparin injection 5,000 Units Start: 04/26/21 0715 SCDs Start:  04/26/21 0704    Family Communication: Spouse at bedside   Disposition Plan: Status is: Observation The patient will require care spanning > 2 midnights and should be moved to inpatient because: History of resistant  hypertension-presented with malignant hypertension-difficult situation-need to reconcile home medication/continued observation to ensure her blood pressure is stable before safe disposition.  Planned Discharge Destination:Home likely in the next day or so depending on clinical improvement.   Diet: Diet Order             Diet clear liquid Room service appropriate? Yes; Fluid consistency: Thin  Diet effective now                     Antimicrobial agents: Anti-infectives (From admission, onward)    None        MEDICATIONS: Scheduled Meds:  amLODipine  5 mg Oral Daily   carvedilol  25 mg Oral BID   chlorthalidone  25 mg Oral Daily   cloNIDine  0.2 mg Transdermal Weekly   divalproex  500 mg Oral Daily   heparin  5,000 Units Subcutaneous Q8H   hydrALAZINE  10 mg Intravenous Once   hydrALAZINE  75 mg Oral TID   rOPINIRole  0.5 mg Oral QHS   spironolactone  25 mg Oral Daily   Continuous Infusions: PRN Meds:.acetaminophen **OR** acetaminophen, clonazePAM, cloNIDine, hydrALAZINE, HYDROcodone-acetaminophen, labetalol, ondansetron **OR** ondansetron (ZOFRAN) IV   I have personally reviewed following labs and imaging studies  LABORATORY DATA: CBC: Recent Labs  Lab 04/25/21 1802  WBC 9.3  NEUTROABS 7.4  HGB 9.9*  HCT 34.3*  MCV 67.7*  PLT PLATELET CLUMPS NOTED ON SMEAR, COUNT APPEARS ADEQUATE    Basic Metabolic Panel: Recent Labs  Lab 04/25/21 1802  NA 138  K 3.7  CL 103  CO2 22  GLUCOSE 106*  BUN 12  CREATININE 1.09*  CALCIUM 9.8  MG 1.9  PHOS 3.4    GFR: Estimated Creatinine Clearance: 36.9 mL/min (A) (by C-G formula based on SCr of 1.09 mg/dL (H)).  Liver Function Tests: Recent Labs  Lab 04/25/21 1802  AST 21  ALT 14  ALKPHOS 69  BILITOT 0.6  PROT 7.1  ALBUMIN 4.0   No results for input(s): LIPASE, AMYLASE in the last 168 hours. No results for input(s): AMMONIA in the last 168 hours.  Coagulation Profile: No results for input(s): INR,  PROTIME in the last 168 hours.  Cardiac Enzymes: No results for input(s): CKTOTAL, CKMB, CKMBINDEX, TROPONINI in the last 168 hours.  BNP (last 3 results) No results for input(s): PROBNP in the last 8760 hours.  Lipid Profile: No results for input(s): CHOL, HDL, LDLCALC, TRIG, CHOLHDL, LDLDIRECT in the last 72 hours.  Thyroid Function Tests: No results for input(s): TSH, T4TOTAL, FREET4, T3FREE, THYROIDAB in the last 72 hours.  Anemia Panel: No results for input(s): VITAMINB12, FOLATE, FERRITIN, TIBC, IRON, RETICCTPCT in the last 72 hours.  Urine analysis: No results found for: COLORURINE, APPEARANCEUR, LABSPEC, PHURINE, GLUCOSEU, HGBUR, BILIRUBINUR, KETONESUR, PROTEINUR, UROBILINOGEN, NITRITE, LEUKOCYTESUR  Sepsis Labs: Lactic Acid, Venous    Component Value Date/Time   LATICACIDVEN 1.26 04/12/2017 1620    MICROBIOLOGY: No results found for this or any previous visit (from the past 240 hour(s)).  RADIOLOGY STUDIES/RESULTS: CT Head Wo Contrast  Result Date: 04/25/2021 CLINICAL DATA:  Altered mental status. EXAM: CT HEAD WITHOUT CONTRAST TECHNIQUE: Contiguous axial images were obtained from the base of the skull through the vertex without intravenous contrast. RADIATION DOSE REDUCTION: This exam was  performed according to the departmental dose-optimization program which includes automated exposure control, adjustment of the mA and/or kV according to patient size and/or use of iterative reconstruction technique. COMPARISON:  None. FINDINGS: Brain: Mild chronic ischemic white matter disease is noted. No mass effect or midline shift is noted. Ventricular size is within normal limits. There is no evidence of mass lesion, hemorrhage or acute infarction. Vascular: No hyperdense vessel or unexpected calcification. Skull: Normal. Negative for fracture or focal lesion. Sinuses/Orbits: No acute finding. Other: None. IMPRESSION: No acute intracranial abnormality seen Electronically Signed   By:  Lupita Raider M.D.   On: 04/25/2021 19:41   DG Chest Portable 1 View  Result Date: 04/25/2021 CLINICAL DATA:  Somnolence EXAM: PORTABLE CHEST 1 VIEW COMPARISON:  07/20/2015 FINDINGS: Transverse diameter of heart is increased. There are no signs of pulmonary edema or focal pulmonary consolidation. Right lateral CP angle is not included in the image. There is no significant pleural effusion or pneumothorax. Left hemidiaphragm is elevated. This may be due to eventration or paralysis. IMPRESSION: Cardiomegaly. There are no signs of pulmonary edema or new focal infiltrates. Electronically Signed   By: Ernie Avena M.D.   On: 04/25/2021 18:47     LOS: 0 days   Jeoffrey Massed, MD  [facility]    To contact the attending provider between 7A-7P or the covering provider during after hours 7P-7A, please log into the web site [email or web address] and access using universal [place] password for that web site. If you do not have the password, please call the hospital operator.  04/26/2021, 10:56 AM

## 2021-04-26 NOTE — Assessment & Plan Note (Signed)
Stable

## 2021-04-26 NOTE — Progress Notes (Addendum)
Meds that patient claims to be taking. Disposed of empty and outdated bottles. ? ?Albuterol MDI ?Albuterol nebulizer ?Amlodipine '5mg'$  qday ?Coreg '25mg'$  BID ?Clonidine 0.'2mg'$  patch qwk ?Vit D 50k unit qwk ?Hydralazine '100mg'$  TID ?Vicodin 10/325 q4prn ?Ferrous sulfate '325mg'$  qday ?Kdur 62mq qday ?Naloxone '4mg'$ /Garrel Ridgelspray PRN ?Ropinorole '1mg'$  TID ?Vitamin C PRN ?Depakote '500mg'$  ER only takes 3x a week maybe ? ?Compliance is not the greatest since she has multiple bottles of certain pills. She refused to give up Lasix bottle. ? ? ? ?Dispose bottles: ? ? ? ?

## 2021-04-26 NOTE — H&P (Signed)
?History and Physical  ? ? ?Tamara Howe ZOX:[id number] DOB: [birth year] DOA: 04/25/2021 ? ?DOS: the patient was seen and examined on 04/25/2021 ? ?[facility]: [facility]  ? ?Patient coming from: Clinic ? ?I have personally briefly reviewed patient's old medical records in [place] ? ?CC: hypertension, altered mental status ?HPI: ?76 year old female with a history of hypertension, CKD stage IIIa, chronic low back pain, history of wheelchair dependence presents to the ER today from the neurosurgery office.  Patient had epidural steroid injection today with dexamethasone, bupivacaine, lidocaine.  She had to be extremely hypertensive after her procedure.  She was also noted to be altered mentally.  Blood pressure noted to be 212/110 in and 234/124.  Patient brought to the ER for evaluation. ? ?Patient was initially confused.  Per the EDP, patient is normally not oriented to date.  This is confirmed by the husband.  She was started emergently on IV Cleviprex. ? ?Critical care was consulted.  They recommended stopping the Cleviprex.  Patient has been off her clonidine patch since last Friday. ? ?Blood pressure still elevated after Cleviprex was discontinued.  Patient required multiple antihypertensive pushes in order to decrease her blood pressure. ? ?After blood pressure stabilized for an hour, TRH asked to admit patient.  ? ?ED Course: started on IV cleviprex due to malignant hypertension and mental status changes. CT head negative for CVA, bleeding.  ? ?Review of Systems:  ?Review of Systems  ?Unable to perform ROS: Mental acuity  ? ?Past Medical History:  ?Diagnosis Date  ? Abdominal mass, LLQ (left lower quadrant)   ? Anemia   ? Anxiety   ? Asthma   ? Bladder infection, chronic   ? Cellulitis and abscess of leg 02/08/2017  ? Cellulitis and abscess of right lower extremity   ? Cellulitis of right lower extremity 05/03/2017  ? Chest pain   ? Chronic pain   ? Complication of anesthesia   ? "stopped breathing  with tumor removal surgery, not happened since then"  ? Constipation   ? Cough   ? Depression   ? Dietary counseling   ? Dysuria   ? Edema extremities   ? Enthesopathy of knee   ? Esophagitis   ? GERD (gastroesophageal reflux disease)   ? Headache   ? Heart murmur   ? High risk medication use   ? History of bronchitis   ? Hypercholesterolemia   ? Hypertension   ? Insomnia   ? 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 Lipoma of skin   ? Low back pain   ? Malaise and fatigue   ? MSSA (methicillin susceptible Staphylococcus aureus) infection 04/24/2017  ? Nocturia   ? Noncompliance with medications   ? Osteoarthritis   ? PONV (postoperative nausea and vomiting)   ? Prediabetes   ? Rash   ? Shortness of breath dyspnea   ? Vitamin D deficiency   ? ? ?Past Surgical History:  ?Procedure Laterality Date  ? ABDOMINAL HYSTERECTOMY    ? ANKLE SURGERY Right   ? APPENDECTOMY    ? CARDIAC CATHETERIZATION    ? 12/13/12 [facility], Dr. Gibson Ramp): 30-40% mid LAD.  ? SALIVARY GLAND SURGERY    ? TUMOR EXCISION    ? ? ? reports that she has never smoked. She has never used smokeless tobacco. She reports that she does not drink alcohol and does not use drugs. ? ?Allergies  ?Allergen Reactions  ? Aspirin Shortness Of Breath  ?  respiratory distress  ? Codeine Nausea Only  ? Peanut (Diagnostic) Anaphylaxis  ?  hives and wheezing, anaphylaxis  ? Penicillins Anaphylaxis  ?  Has patient had a PCN reaction causing immediate rash, facial/tongue/throat swelling, SOB or lightheadedness with hypotension: Yes ?Has patient had a PCN reaction causing severe rash involving mucus membranes or skin necrosis: No ?Has patient had a PCN reaction that required hospitalization Yes ?Has patient had a PCN reaction occurring within the last 10 years: No ?If all of the above answers are "NO", then may proceed with Cephalosporin use. ?  ? Shellfish Allergy Anaphylaxis  ?  Respiratory arrest  ? Tetracycline Anaphylaxis  ?  Respiratory arrest  ?  Cephalexin Hives  ? Iodinated Contrast Media Hives  ? Latex Hives  ? Morphine Other (See Comments)  ?  Possible resp arrest?? Patient is unsure  ? Nitrofuran Derivatives Other (See Comments)  ?  Unknown by patient  ? Sulfa Antibiotics Hives  ? ? ?Family History  ?Problem Relation Age of Onset  ? Hypertension Father   ? Diabetes Mellitus II Father   ? Diabetes Mellitus II Sister   ? Asthma Mother   ? ? ?Prior to Admission medications   ?Medication Sig Start Date End Date Taking? Authorizing Provider  ?albuterol (VENTOLIN HFA) 108 (90 Base) MCG/ACT inhaler Inhale 2 puffs into the lungs every 6 (six) hours as needed for wheezing or shortness of breath.   Yes [provider]  ?amLODipine (NORVASC) 5 MG tablet Take 5 mg by mouth daily. 03/18/21  Yes [provider]  ?cloNIDine (CATAPRES - DOSED IN MG/24 HR) 0.2 mg/24hr patch Place 0.2 mg onto the skin once a week.   Yes [provider]  ?HYDROcodone-acetaminophen (NORCO) 10-325 MG tablet Take 1 tablet by mouth every 6 (six) hours as needed for moderate pain.    Yes [provider]  ?rOPINIRole (REQUIP) 0.5 MG tablet Take 0.5 mg by mouth at bedtime. 04/01/21  Yes [provider]  ?acetaminophen (TYLENOL) 500 MG tablet Take 1,000 mg by mouth every 6 (six) hours as needed for moderate pain.    [provider]  ?albuterol (PROVENTIL) (2.5 MG/3ML) 0.083% nebulizer solution Take 2.5 mg by nebulization every 6 (six) hours as needed for wheezing or shortness of breath.    [provider]  ?atorvastatin (LIPITOR) 40 MG tablet Take 40 mg by mouth daily.    [provider]  ?chlorthalidone (HYGROTON) 25 MG tablet Take 25 mg by mouth daily.    [provider]  ?clonazePAM (KLONOPIN) 0.5 MG tablet Take 0.5 mg by mouth 2 (two) times daily as needed for anxiety.    [provider]  ?divalproex (DEPAKOTE ER) 500 MG 24 hr tablet Take 500 mg by mouth daily.    [provider]  ?EPINEPHrine  (EPIPEN 2-PAK) 0.3 mg/0.3 mL IJ SOAJ injection Inject 0.3 mg into the muscle once. For anaphylaxis    [provider]  ?hydrALAZINE (APRESOLINE) 50 MG tablet Take 75 mg by mouth 3 (three) times daily.    [provider]  ?metoprolol tartrate (LOPRESSOR) 50 MG tablet Take 1 tablet (50 mg total) by mouth 2 (two) times daily. 02/11/17   Yaakov Guthrie, MD  ?NIFEdipine (PROCARDIA-XL/ADALAT CC) 60 MG 24 hr tablet Take 60 mg by mouth daily.    [provider]  ?potassium chloride SA (K-DUR,KLOR-CON) 20 MEQ tablet Take 1 tablet (20 mEq total) by mouth daily. Take only when taking the Lasix 02/11/17   Matcha, Anupama,  MD  ?spironolactone (ALDACTONE) 25 MG tablet Take 25 mg by mouth daily.    [provider]  ?Vitamin D, Ergocalciferol, (DRISDOL) 50000 units CAPS capsule Take 50,000 Units by mouth every Monday, Wednesday, and Friday.     [provider]  ? ? ?Physical Exam: ?Vitals:  ? 04/25/21 2330 04/25/21 2345 04/26/21 0000 04/26/21 0001  ?BP: (!) 143/90 (!) 145/92 (!) 170/99 (!) 170/99  ?Pulse: (!) 102 (!) 103 (!) 107 (!) 108  ?Resp: (!) 5 (!) '8 20 20  '$ ?Temp:      ?SpO2: 93% 94% 97% 97%  ?Weight:      ?Height:      ? ? ?Physical Exam ?Vitals and nursing note reviewed.  ?Constitutional:   ?   General: She is not in acute distress. ?   Appearance: She is not toxic-appearing.  ?   Comments: Sleepy but arousable ?Appears chronically ill  ?HENT:  ?   Head: Normocephalic and atraumatic.  ?Cardiovascular:  ?   Rate and Rhythm: Normal rate and regular rhythm.  ?Pulmonary:  ?   Effort: Pulmonary effort is normal.  ?Abdominal:  ?   General: Bowel sounds are normal. There is no distension.  ?   Tenderness: There is no abdominal tenderness. There is no right CVA tenderness.  ?Musculoskeletal:  ?   Comments: Right lower leg bent at an awkward angle especially at the right knee. Husband states that this is normal for the patient.  ?Skin: ?   General: Skin is warm and dry.  ?   Capillary  Refill: Capillary refill takes less than 2 seconds.  ?   Findings: Lesion present.  ?   Comments: Bilateral pretibial venous stasis dermatitis/stasis.  ?Neurological:  ?   Comments: Sleepy but arousable  ?  ? ?Lab

## 2021-04-26 NOTE — Subjective & Objective (Signed)
CC: hypertension, altered mental status ?HPI: ?76 year old female with a history of hypertension, CKD stage IIIa, chronic low back pain, history of wheelchair dependence presents to the ER today from the neurosurgery office.  Patient had epidural steroid injection today with dexamethasone, bupivacaine, lidocaine.  She had to be extremely hypertensive after her procedure.  She was also noted to be altered mentally.  Blood pressure noted to be 212/110 in and 234/124.  Patient brought to the ER for evaluation. ? ?Patient was initially confused.  Per the EDP, patient is normally not oriented to date.  This is confirmed by the husband.  She was started emergently on IV Cleviprex. ? ?Critical care was consulted.  They recommended stopping the Cleviprex.  Patient has been off her clonidine patch since last Friday. ? ?Blood pressure still elevated after Cleviprex was discontinued.  Patient required multiple antihypertensive pushes in order to decrease her blood pressure. ? ?After blood pressure stabilized for an hour, TRH asked to admit patient. ?

## 2021-04-26 NOTE — Assessment & Plan Note (Signed)
Continue home opiates ?

## 2021-04-26 NOTE — Assessment & Plan Note (Signed)
Admit to progressive bed. Continue with prn HTN meds. Continue with home BP meds. Likely due to recent epidural steroid injection. ?

## 2021-04-27 ENCOUNTER — Other Ambulatory Visit (HOSPITAL_COMMUNITY): Payer: Self-pay

## 2021-04-27 ENCOUNTER — Observation Stay (HOSPITAL_COMMUNITY): Payer: Medicare Other

## 2021-04-27 DIAGNOSIS — E440 Moderate protein-calorie malnutrition: Secondary | ICD-10-CM | POA: Diagnosis not present

## 2021-04-27 DIAGNOSIS — Z79891 Long term (current) use of opiate analgesic: Secondary | ICD-10-CM | POA: Diagnosis not present

## 2021-04-27 DIAGNOSIS — I161 Hypertensive emergency: Secondary | ICD-10-CM | POA: Diagnosis not present

## 2021-04-27 DIAGNOSIS — N1831 Chronic kidney disease, stage 3a: Secondary | ICD-10-CM | POA: Diagnosis not present

## 2021-04-27 MED ORDER — ENSURE ENLIVE PO LIQD: 237.0000 mL | Freq: Two times a day (BID) | ORAL | Status: DC

## 2021-04-27 MED ORDER — HYDRALAZINE HCL 50 MG PO TABS
50.0000 mg | ORAL_TABLET | Freq: Three times a day (TID) | ORAL | Status: DC
  Administered 2021-04-27: 50 mg via ORAL
  Filled 2021-04-27: qty 1

## 2021-04-27 MED ORDER — AMLODIPINE BESYLATE 5 MG PO TABS
5.0000 mg | ORAL_TABLET | Freq: Every day | ORAL | 0 refills | Status: AC
  Filled 2021-04-27: qty 30, 30d supply, fill #0

## 2021-04-27 MED ORDER — ADULT MULTIVITAMIN W/MINERALS CH: 1.0000 | ORAL_TABLET | Freq: Every day | ORAL | Status: DC

## 2021-04-27 MED ORDER — ENSURE ENLIVE PO LIQD: 237.0000 mL | Freq: Two times a day (BID) | ORAL | 12 refills | Status: AC

## 2021-04-27 MED ORDER — HYDRALAZINE HCL 50 MG PO TABS
50.0000 mg | ORAL_TABLET | Freq: Three times a day (TID) | ORAL | 0 refills | Status: AC
  Filled 2021-04-27: qty 90, 30d supply, fill #0

## 2021-04-27 MED ORDER — CLONIDINE 0.2 MG/24HR TD PTWK
0.2000 mg | MEDICATED_PATCH | TRANSDERMAL | 0 refills | Status: AC
  Filled 2021-04-27: qty 4, 28d supply, fill #0

## 2021-04-27 MED ORDER — SPIRONOLACTONE 25 MG PO TABS
12.5000 mg | ORAL_TABLET | Freq: Every day | ORAL | 0 refills | Status: AC | PRN
Start: 2021-04-27 — End: ?
  Filled 2021-04-27: qty 30, 60d supply, fill #0

## 2021-04-27 MED ORDER — LORAZEPAM 2 MG/ML IJ SOLN
0.5000 mg | Freq: Once | INTRAMUSCULAR | Status: AC | PRN
  Administered 2021-04-27: 0.5 mg via INTRAVENOUS
  Filled 2021-04-27: qty 1

## 2021-04-27 MED ORDER — CARVEDILOL 25 MG PO TABS
25.0000 mg | ORAL_TABLET | Freq: Two times a day (BID) | ORAL | 0 refills | Status: AC
  Filled 2021-04-27: qty 60, 30d supply, fill #0

## 2021-04-27 MED ORDER — ATORVASTATIN CALCIUM 40 MG PO TABS
40.0000 mg | ORAL_TABLET | Freq: Every day | ORAL | 0 refills | Status: AC
Start: 2021-04-27 — End: ?
  Filled 2021-04-27: qty 30, 30d supply, fill #0

## 2021-04-27 MED ORDER — ACETAMINOPHEN 325 MG PO TABS: 650.0000 mg | ORAL_TABLET | Freq: Four times a day (QID) | ORAL | Status: AC | PRN

## 2021-04-27 NOTE — Progress Notes (Signed)
Patient discharge teaching given, including activity, diet, follow-up appoints, and medications. Patient verbalized understanding of all discharge instructions. IV access was d/c'd. Vitals are stable. Skin is intact except as charted in most recent assessments. Pt to be escorted out by NT, to be driven home by family. 

## 2021-04-27 NOTE — TOC Initial Note (Addendum)
Transition of Care (TOC) - Initial/Assessment Note  ? ? ?Patient Details  ?Name: Tamara Howe ?MRN: [id number] ?Date of Birth: [birth year] ? ?Transition of Care (TOC) CM/SW Contact:    ?Carles Collet, RN ?Phone Number: ?04/27/2021, 8:41 AM ? ?Clinical Narrative:           ?Spoke w patient's spouse over the phone.  ?They are interested in [facility] services.  ?Requested order for PT OT RN from MD. RN for medication education/ compliance. ?[facility]- declined ?[facility]- declined ?[facility]- accepted. Please fax referral to [phone] ? ?Spouse states that patient spends most of her time in a WC, they have WC, RW and cane at home. He declined additional DME needs ? ? ?Expected Discharge Plan: [place] ?Barriers to Discharge: Continued Medical Work up ? ? ?Patient Goals and CMS Choice ?Patient states their goals for this hospitalization and ongoing recovery are:: return home ?CMS Medicare.gov Compare Post Acute Care list provided to:: Other (Comment Required) ?Choice offered to / list presented to : Spouse ? ?Expected Discharge Plan and Services ?Expected Discharge Plan: [place] ?  ?Discharge Planning Services: CM Consult ?Post Acute Care Choice: Home Health ?Living arrangements for the past 2 months: [place] ?                ?DME Arranged: N/A ?  ?  ?  ?  ?HH Arranged: OT, PT, RN ?[place] Agency: [facility] ?Date HH Agency Contacted: 04/27/21 ?Time [place]: [phone] ?Representative spoke with at [place]: Lenna Sciara (Referral pending) ? ?Prior Living Arrangements/Services ?Living arrangements for the past 2 months: [place] ?Lives with:: Spouse ?  ?Do you feel safe going back to the place where you live?: Yes      ?  ?  ?Current home services: DME ?  ? ?Activities of Daily Living ?Home Assistive Devices/Equipment: Cane (specify quad or straight), Wheelchair (straight) ?ADL Screening (condition at time of admission) ?Patient's cognitive ability adequate to safely  complete daily activities?: Yes ?Is the patient deaf or have difficulty hearing?: No ?Does the patient have difficulty seeing, even when wearing glasses/contacts?: No ?Does the patient have difficulty concentrating, remembering, or making decisions?: No ?Patient able to express need for assistance with ADLs?: Yes ?Does the patient have difficulty dressing or bathing?: No ?Independently performs ADLs?: Yes (appropriate for developmental age) ?Does the patient have difficulty walking or climbing stairs?: Yes ?Weakness of Legs: Both ?Weakness of Arms/Hands: None ? ?Permission Sought/Granted ?  ?  ?   ?   ?   ?   ? ?Emotional Assessment ?  ?  ?  ?  ?  ?  ? ?Admission diagnosis:  Malignant hypertension [I10] ?Hypertensive emergency [I16.1] ?Patient Active Problem List  ? Diagnosis Date Noted  ? Hypertensive emergency 04/26/2021  ? Wheelchair dependence 04/26/2021  ? Stage 3a chronic kidney disease (CKD) ([place]) 04/25/2021  ? Degenerative lumbar disc 04/25/2021  ? Gastroesophageal reflux disease 04/25/2021  ? Chronic prescription opiate use 04/25/2021  ? Microcytic hypochromic anemia 05/03/2017  ? Anxiety 05/03/2017  ? Normocytic normochromic anemia 02/07/2017  ? Essential hypertension 02/07/2017  ? CAD (coronary artery disease) 03/06/2016  ? Chronic low back pain 03/06/2016  ? Dyslipidemia 01/04/2009  ? ?PCP:  [facility] ?Pharmacy:   ?[place], [address] ?[place] [id number] ?Phone: [phone] Fax: [phone] ? ? ? ? ?Social Determinants of Health (SDOH) Interventions ?  ? ?Readmission Risk  Interventions ?   ? View : No data to display.  ?  ?  ?  ? ? ? ?

## 2021-04-27 NOTE — Discharge Summary (Addendum)
Physician Discharge Summary  ?Tamara Howe TXM:[id number] DOB: [birth year] DOA: 04/25/2021 ? ?[facility]: [facility] ? ?Admit date: 04/25/2021 ?Discharge date: 04/27/2021 ? ?Admitted From: Home ?Disposition: Home ? ?Recommendations for Outpatient Follow-up:  ?Follow up with PCP in 1-2 weeks ?Please obtain BMP/CBC in one week ?Please adjust home regimen for optimal blood pressure control ? ?Home Health:YES ? ? ?Discharge Condition:Stable ?CODE STATUS:FULL ?Diet recommendation: Heart Healthy ? ?Brief/Interim Summary: ? ?Patient is a 76 y.o.  female with history of resistant hypertension on multiple antihypertensives-presented to the ED on 3/20 from her neurosurgeons office after she was found to have uncontrolled hypertension and confusion. ?  ?  ?Significant events: ?3/20>> presented to ED for uncontrolled hypertension/confusion after getting epidural injections at the neurosurgical office.  Briefly started on Cleviprex infusion-but rapidly improved.  Admit to [facility] on 3/21. ?  ?Significant studies: ?3/20>> CXR: No PNA ?3/20>> CT head: No acute intracranial abnormality ?3/22>> MRI brain No acute finding. Extensive chronic small-vessel ischemic changes throughout the brain as outlined above. Hemosiderin deposition in the right inferior cerebellum and in the cerebral hemispheres, particularly the halami, consistent with prior micro hemorrhagic ischemic events ? ? ?Hypertensive emergency: Briefly required Cleviprex infusion-BP now better.   ?-She has been transitioned to demand, blood pressure is controlled.  Discussion below regarding home medications . ?  ?Resistant hypertension: Appears to be on at least 5 antihypertensives at home-both patient and spouse at bedside very confused on what medication she is exactly on.  As well it does appear patient herself is noncompliant with her home medications, she was seen and consulted by the pharmacist, apparently takes her medications only to days a week, and skip some other  days, as well she is not very clear about what kind of home medication she is taking. ?-Her medication has been reviewed, and home medications has been adjusted, she is to be discharged on current regimen, all other meds has been discarded and she is given new medication from [place], discharge regimen includes: ?-Amlodipine 5 mg oral daily ?-Hydralazine 50 mg oral 3 times daily ?-Coreg 25 mg oral twice daily ?-Clonidine 0.2 mg/24 hours prior ?-Aldactone 25 mg oral daily ? 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 ? ?  ?Chronic back pain/chronic right knee OA: Mostly wheelchair-bound-but able to take a few steps.  Gets epidural injections frequently-continue home regimen of narcotics. ?  ?CKD stage IIIa: At baseline-follow electrolytes periodically. ?  ?HLD: Continue statin ?  ?Restless leg syndrome: Continue Requip ?  ?Malnutrition of moderate degree ?-Continue with supplements. ?  ?Discharge Diagnoses:  ?Principal Problem: ?  Hypertensive emergency ?Active Problems: ?  Stage 3a chronic kidney disease (CKD) ([place]) ?  Chronic prescription opiate use ?  Wheelchair dependence ?  Malnutrition of moderate degree ? ? ? ?Discharge Instructions ? ?Discharge Instructions   ? ? Diet - low sodium heart healthy   Complete by: As directed ?  ? Discharge instructions   Complete by: As directed ?  ? Follow with Primary MD [facility] in 7 days  ? ?Get CBC, CMP, checked  by Primary MD next visit.  ? ? ?  Activity: As tolerated with Full fall precautions use walker/cane & assistance as  needed ? ? ?Disposition Home  ? ? ?Diet: Heart Healthy  , with feeding assistance and aspiration precautions. ? ? ? ?On your next visit with your primary care physician please Get Medicines reviewed and adjusted. ? ? ?Please request your Prim.MD to go over all Hospital Tests and Procedure/Radiological results at the follow up, please get all Hospital records sent to your Prim MD by signing hospital release before you go home. ? ? ?If you experience worsening of your admission symptoms, develop shortness of breath, life threatening emergency, suicidal or homicidal thoughts you must seek medical attention immediately by calling 911 or calling your MD immediately  if symptoms less severe. ? ?You Must read complete instructions/literature along with all the possible adverse reactions/side effects for all the Medicines you take and that have been prescribed to you. Take any new Medicines after you have completely understood and accpet all the possible adverse reactions/side effects.  ? ?Do not drive, operating heavy machinery, perform activities at heights, swimming or participation in water activities or provide baby sitting services if your were admitted for syncope or siezures until you have seen by Primary MD or a Neurologist and advised to do so again. ? ?Do not drive when taking Pain medications.  ? ? ?Do not take more than prescribed Pain, Sleep and Anxiety Medications ? ?Special Instructions: If you have smoked or chewed Tobacco  in the last 2 yrs please stop smoking, stop any regular Alcohol  and or any Recreational drug use. ? ?Wear Seat belts while driving. ? ? ?Please note ? ?You were cared for by a hospitalist during your hospital stay. If you have any questions about your discharge medications or the care you received while you were in the hospital after you are discharged, you can call the unit and asked to speak with the hospitalist on call if the hospitalist that took care of you is not available. Once  you are discharged, your primary care physician will handle any further medical issues. Please note that NO REFILLS for any discharge medications will be authorized once you are discharged, as it is imperative that you return to your primary care physician (or establish a relationship with a primary care physician if you do not have one) for your aftercare needs so that they can reassess your need for medications and monitor your lab values.  ? Increase activity slowly   Complete by: As directed ?  ? ?  ? ?Allergies as of 04/27/2021   ? ?   Reactions  ? Aspirin Shortness Of Breath  ? respiratory distress  ? Codeine Nausea Only  ? Peanut (diagnostic) Anaphylaxis  ? hives and wheezing, anaphylaxis  ? Penicillins Anaphylaxis  ? Has patient had a PCN reaction causing immediate rash, facial/tongue/throat swelling, SOB or lightheadedness with hypotension: Yes ?Has patient had a PCN reaction causing severe rash involving mucus membranes or skin necrosis: No ?Has patient had a PCN reaction that required hospitalization Yes ?Has patient had a PCN reaction occurring within the last 10 years: No ?If all of the above answers are "NO", then may proceed with Cephalosporin use.  ? Shellfish Allergy Anaphylaxis  ? Respiratory arrest  ? Tetracycline Anaphylaxis  ? Respiratory arrest  ? Cephalexin Hives  ? Iodinated Contrast Media Hives  ? Latex Hives  ? Morphine Other (See Comments)  ? Possible resp arrest?? Patient is unsure  ? Nitrofuran Derivatives Other (See Comments)  ?  Unknown by patient  ? Sulfa Antibiotics Hives  ? ?  ? ?  ?Medication List  ?  ? ?STOP taking these medications   ? ?chlorthalidone 25 MG tablet ?Commonly known as: HYGROTON ?  ?clonazePAM 0.5 MG tablet ?Commonly known as: KLONOPIN ?  ?divalproex 500 MG 24 hr tablet ?Commonly known as: DEPAKOTE ER ?  ?isosorbide mononitrate 60 MG 24 hr tablet ?Commonly known as: IMDUR ?  ?metoprolol tartrate 50 MG tablet ?Commonly known as: LOPRESSOR ?  ?NIFEdipine 60 MG 24 hr  tablet ?Commonly known as: ADALAT CC ?  ?olmesartan 40 MG tablet ?Commonly known as: BENICAR ?  ?potassium chloride SA 20 MEQ tablet ?Commonly known as: KLOR-CON M ?  ? ?  ? ?TAKE these medications   ? ?acetamino

## 2021-04-27 NOTE — TOC Transition Note (Signed)
Transition of Care (TOC) - CM/SW Discharge Note ? ? ?Patient Details  ?Name: Tamara Howe ?MRN: [id number] ?Date of Birth: 11-19-1945 ? ?Transition of Care (TOC) CM/SW Contact:  ?Cyndi Bender, RN ?Phone Number: ?04/27/2021, 4:14 PM ? ? ?Clinical Narrative:    ?Patient stable for discharge. Tiffany with [facility] notified of discharge. Patient's husband will transport home. ? ? ? ?Final next level of care: [place] ?Barriers to Discharge: Barriers Resolved ? ? ?Patient Goals and CMS Choice ?Patient states their goals for this hospitalization and ongoing recovery are:: return home ?CMS Medicare.gov Compare Post Acute Care list provided to:: Other (Comment Required) ?Choice offered to / list presented to : Spouse ? ?Discharge Placement ?  ?          home ?  ?  ?  ? ?Discharge Plan and Services ?  ?Discharge Planning Services: CM Consult ?Post Acute Care Choice: Home Health          ?DME Arranged: N/A ?  ?  ?  ?  ?HH Arranged: OT, PT, RN ?[place] Agency: [facility] ?Date HH Agency Contacted: 04/27/21 ?Time [place]: [phone] ?Representative spoke with at [place]: Lenna Sciara (Referral pending) ? ?Social Determinants of Health (SDOH) Interventions ?  ? ? ?Readmission Risk Interventions ?   ? View : No data to display.  ?  ?  ?  ? ? ? ? ? ?

## 2021-04-27 NOTE — Evaluation (Signed)
Physical Therapy Evaluation ?Patient Details ?Name: Tamara Howe ?MRN: [id number] ?DOB: [birth year] ?Today's Date: 04/27/2021 ? ?History of Present Illness ? Pt is a 76 y/o female admitted secondary to [place] following receiving back injection. Pt found to be in hypertensive emergency. PMH includes asthma, HTN, CVA.  ?Clinical Impression ? Pt admitted secondary to problem above with deficits below. Pt requiring min A for transfers this session. Reporting increased pain in R knee which limited ambulation. Recommending HHPT, HHOT to help increase independence and safety at home. Would also benefit from [facility] to help with ADL tasks to decrease burden of care. Will continue to follow acutely.    ?   ? ?Recommendations for follow up therapy are one component of a multi-disciplinary discharge planning process, led by the attending physician.  Recommendations may be updated based on patient status, additional functional criteria and insurance authorization. ? ?Follow Up Recommendations Home health PT (HHOT, [facility]) ? ?  ?Assistance Recommended at Discharge Frequent or constant Supervision/Assistance  ?Patient can return home with the following ? A little help with walking and/or transfers;A little help with bathing/dressing/bathroom;Assistance with cooking/housework;Help with stairs or ramp for entrance;Assist for transportation ? ?  ?Equipment Recommendations None recommended by PT  ?Recommendations for Other Services ?    ?  ?Functional Status Assessment Patient has had a recent decline in their functional status and demonstrates the ability to make significant improvements in function in a reasonable and predictable amount of time.  ? ?  ?Precautions / Restrictions Precautions ?Precautions: Fall ?Restrictions ?Weight Bearing Restrictions: No  ? ?  ? ?Mobility ? Bed Mobility ?Overal bed mobility: Needs Assistance ?Bed Mobility: Supine to Sit ?  ?  ?Supine to sit: Min assist ?  ?  ?General bed mobility comments: Min A for trunk  elevation to come to sitting. ?  ? ?Transfers ?Overall transfer level: Needs assistance ?Equipment used: Straight cane ?Transfers: Sit to/from Stand, Bed to chair/wheelchair/BSC ?Sit to Stand: Min assist ?Stand pivot transfers: Min assist ?  ?  ?  ?  ?General transfer comment: Min A for steadying to stand and transfer to chair. No overt LOB noted. ?  ? ?Ambulation/Gait ?  ?  ?  ?  ?  ?  ?  ?  ? ?Stairs ?  ?  ?  ?  ?  ? ?Wheelchair Mobility ?  ? ?Modified Rankin (Stroke Patients Only) ?  ? ?  ? ?Balance Overall balance assessment: Needs assistance ?Sitting-balance support: No upper extremity supported, Feet supported ?Sitting balance-Leahy Scale: Fair ?  ?  ?Standing balance support: Single extremity supported ?Standing balance-Leahy Scale: Poor ?Standing balance comment: Reliant on at least 1 UE support and external support ?  ?  ?  ?  ?  ?  ?  ?  ?  ?  ?  ?   ? ? ? ?Pertinent Vitals/Pain Pain Assessment ?Pain Assessment: Faces ?Faces Pain Scale: Hurts even more ?Pain Location: R knee ?Pain Descriptors / Indicators: Grimacing, Guarding ?Pain Intervention(s): Limited activity within patient's tolerance, Monitored during session, Repositioned  ? ? ?Home Living Family/patient expects to be discharged to:: Private residence ?Living Arrangements: Spouse/significant other ?Available Help at Discharge: Family ?Type of Home: House ?Home Access: Level entry ?  ?  ?  ?Home Layout: One level ?Home Equipment: Conservation officer, nature (2 wheels);Cane - single point;Shower seat;BSC/3in1;Wheelchair - manual ?   ?  ?Prior Function Prior Level of Function : Needs assist ?  ?  ?  ?  ?  ?  ?  Mobility Comments: Husband sometimes assists with ADLs and transfers. Pt sometimes able to perform on her own ?ADLs Comments: Husband assists with ADLs. ?  ? ? ?Hand Dominance  ?   ? ?  ?Extremity/Trunk Assessment  ? Upper Extremity Assessment ?Upper Extremity Assessment: Generalized weakness ?  ? ?Lower Extremity Assessment ?Lower Extremity Assessment: RLE  deficits/detail ?RLE Deficits / Details: Valgus posturing at the knee with increased swelling. Pt reports this is baseline ?  ? ?Cervical / Trunk Assessment ?Cervical / Trunk Assessment: Kyphotic  ?Communication  ? Communication: No difficulties  ?Cognition Arousal/Alertness: Awake/alert ?Behavior During Therapy: [facility] for tasks assessed/performed ?Overall Cognitive Status: Within Functional Limits for tasks assessed ?  ?  ?  ?  ?  ?  ?  ?  ?  ?  ?  ?  ?  ?  ?  ?  ?General Comments: Somewhat self limiting ?  ?  ? ?  ?General Comments General comments (skin integrity, edema, etc.): Pt's husband present throughout ? ?  ?Exercises    ? ?Assessment/Plan  ?  ?PT Assessment Patient needs continued PT services  ?PT Problem List Decreased strength;Decreased range of motion;Decreased balance;Decreased activity tolerance;Decreased mobility;Decreased knowledge of use of DME;Decreased knowledge of precautions;Pain ? ?   ?  ?PT Treatment Interventions DME instruction;Gait training;Functional mobility training;Therapeutic exercise;Therapeutic activities;Balance training;Wheelchair mobility training;Patient/family education   ? ?PT Goals (Current goals can be found in the Care Plan section)  ?Acute Rehab PT Goals ?Patient Stated Goal: to go home ?PT Goal Formulation: With patient ?Time For Goal Achievement: 05/11/21 ?Potential to Achieve Goals: Good ? ?  ?Frequency Min 3X/week ?  ? ? ?Co-evaluation   ?  ?  ?  ?  ? ? ?  ?AM-PAC PT "6 Clicks" Mobility  ?Outcome Measure Help needed turning from your back to your side while in a flat bed without using bedrails?: None ?Help needed moving from lying on your back to sitting on the side of a flat bed without using bedrails?: A Little ?Help needed moving to and from a bed to a chair (including a wheelchair)?: A Little ?Help needed standing up from a chair using your arms (e.g., wheelchair or bedside chair)?: A Little ?Help needed to walk in hospital room?: A Lot ?Help needed climbing 3-5  steps with a railing? : Total ?6 Click Score: 16 ? ?  ?End of Session Equipment Utilized During Treatment: Gait belt ?Activity Tolerance: Patient limited by pain ?Patient left: in chair;with call bell/phone within reach;with chair alarm set ?Nurse Communication: Mobility status ?PT Visit Diagnosis: Other abnormalities of gait and mobility (R26.89);Pain;Difficulty in walking, not elsewhere classified (R26.2) ?Pain - Right/Left: Right ?Pain - part of body: Knee ?  ? ?Time: 6962-9528 ?PT Time Calculation (min) (ACUTE ONLY): 23 min ? ? ?Charges:   PT Evaluation ?$PT Eval Moderate Complexity: 1 Mod ?PT Treatments ?$Therapeutic Activity: 8-22 mins ?  ?   ? ? ?Reuel Derby, PT, DPT  ?Acute Rehabilitation Services  ?Pager: [phone] ?Office: [phone] ? ? ?[place] ?04/27/2021, 9:36 AM ? ?

## 2021-04-27 NOTE — Discharge Instructions (Addendum)
Nutrition Post Hospital Stay °Proper nutrition can help your body recover from illness and injury.   °Foods and beverages high in protein, vitamins, and minerals help rebuild muscle loss, promote healing, & reduce fall risk.  ° °•In addition to eating healthy foods, a nutrition shake is an easy, delicious way to get the nutrition you need during and after your hospital stay ° °It is recommended that you continue to drink 2 bottles per day of:       Ensure Plus for at least 1 month (30 days) after your hospital stay  ° °Tips for adding a nutrition shake into your routine: °As allowed, drink one with vitamins or medications instead of water or juice °Enjoy one as a tasty mid-morning or afternoon snack °Drink cold or make a milkshake out of it °Drink one instead of milk with cereal or snacks °Use as a coffee creamer °  °Available at the following grocery stores and pharmacies:           °* [employer] * [employer] * [employer]  °* [employer]          * [employer] * [employer]  °* [facility]      * Target  * [employer]   °* [employer]  * [employer]   °* [facility] [phone]  °          °For COUPONS visit: [email or web address] or [email or web address]  ° °Suggested Substitutions °Ensure Plus = Boost Plus = Carnation Breakfast Essentials = Boost Compact ° ° °  ° °

## 2021-04-27 NOTE — Progress Notes (Signed)
Initial Nutrition Assessment ? ?DOCUMENTATION CODES:  ? ?Non-severe (moderate) malnutrition in context of chronic illness ? ?INTERVENTION:  ? ?Multivitamin w/ minerals daily ?Ensure Enlive po BID, each supplement provides 350 kcal and 20 grams of protein. ?Discontinue Boost Breeze ?Liberalize pt diet to regular due to poor PO intake and malnutrition.  ?Make meal ordering with assist ?Encourage good PO intake  ? ?NUTRITION DIAGNOSIS:  ? ?Moderate Malnutrition related to chronic illness (uncontrolled HTN) as evidenced by severe muscle depletion, moderate fat depletion. ? ?GOAL:  ? ?Patient will meet greater than or equal to 90% of their needs ? ?MONITOR:  ? ?PO intake, Supplement acceptance, Labs, Weight trends ? ?REASON FOR ASSESSMENT:  ? ?Malnutrition Screening Tool ?  ? ?ASSESSMENT:  ? ?76 y.o. female presented to the ED from neurosurgery's office after being hypertensive and altered mental status. PMH includes CKI IIIa, GERD, wheelchair dependent, and HTN. Pt admitted with malignant hypertension.  ? ?Pt working with OT. Pt very drowsy, spoke with husband. ? ?Pt husband reports that he does the cooking.Husband reports that pt eats very little at home and she nibbles on food. Husband estimates that pt eats 25-50% of the amount of food on her plate at a time. Reports that when she does have an appetite she eats better. Husband reports that there is supplements at home, but the pt is not drinking them everyday.  ?  ?Husband was unsure of UBW, but stated that pt has reported that she has lost some weight recently. No weights record within past year to assess weight loss. Pt is primarily wheelchair bound, but can take a few steps.  ? ?Discussed additional ways to increase ONS use at home for when pt is not eating great. Will add to AVS.  ? ?Medications reviewed and include: Spironolactone ?Labs reviewed. ? ?NUTRITION - FOCUSED PHYSICAL EXAM: ? ?Flowsheet Row Most Recent Value  ?Orbital Region Moderate depletion  ?Upper  Arm Region Moderate depletion  ?Thoracic and Lumbar Region Moderate depletion  ?Buccal Region Moderate depletion  ?Temple Region Severe depletion  ?Clavicle Bone Region Severe depletion  ?Clavicle and Acromion Bone Region Severe depletion  ?Scapular Bone Region Severe depletion  ?Dorsal Hand Severe depletion  ?Patellar Region Severe depletion  ?Anterior Thigh Region Severe depletion  ?Posterior Calf Region Severe depletion  ?Edema (RD Assessment) None  ?Hair Reviewed  ?Eyes Reviewed  ?Mouth Unable to assess  ?Skin Reviewed  ?Nails Reviewed  ? ?Diet Order:   ?Diet Order   ? ?       ?  Diet regular Room service appropriate? Yes with Assist; Fluid consistency: Thin  Diet effective now       ?  ? ?  ?  ? ?  ? ? ?EDUCATION NEEDS:  ? ?Not appropriate for education at this time ? ?Skin:  Skin Assessment: Reviewed RN Assessment ? ?Last BM:  3/20 ? ?Height:  ?Ht Readings from Last 1 Encounters:  ?04/26/21 '5\' 3"'$  (1.6 m)  ? ?Weight:  ?Wt Readings from Last 1 Encounters:  ?04/27/21 56.2 kg  ? ?Ideal Body Weight:  52.3 kg ? ?BMI:  Body mass index is 21.95 kg/m?. ? ?Estimated Nutritional Needs:  ?Kcal:  1700-1900 ?Protein:  85-100 grams ?Fluid:  >/= 1.7 L ? ? ? ?Hermina Barters RD, LDN ?Clinical Dietitian ?See AMiON for contact information.  ? ?

## 2021-04-27 NOTE — Care Management Obs Status (Signed)
MEDICARE OBSERVATION STATUS NOTIFICATION ? ? ?Patient Details  ?Name: Tamara Howe ?MRN: [id number] ?Date of Birth: [birth year] ? ? ?Medicare Observation Status Notification Given:  Yes ? ? ? ?Carles Collet, RN ?04/27/2021, 8:38 AM ?

## 2021-04-27 NOTE — Evaluation (Signed)
Occupational Therapy Evaluation ?Patient Details ?Name: Tamara Howe ?MRN: [id number] ?DOB: May 26, 1945 ?Today's Date: 04/27/2021 ? ? ?History of Present Illness Pt is a 76 y/o female admitted secondary to [place] following receiving back injection. Pt found to be in hypertensive emergency. PMH includes asthma, HTN, CVA.  ? ?Clinical Impression ?  ?Pt lethargic and sleepy during session.  She needed max assist for stand pivot transfer back to the bed from the recliner with bladder incontinence noted during transfer.  She needed max assist for cleaning in bed with donning of brief and new gown.  Feel she will likely do better when she is more alert based on discussion with spouse.  Feel she will benefit from acute care OT to help increase overall ADL independence as well as decreasing caregiver burden.  She required assist for transfers and some aspects of selfcare PTA.  Spouse feels comfortable per discussion to assist her at the current level at discharge.  Will continue to follow if she stays on acute.  Recommend follow-up HHOT for discharge.    ?   ? ?Recommendations for follow up therapy are one component of a multi-disciplinary discharge planning process, led by the attending physician.  Recommendations may be updated based on patient status, additional functional criteria and insurance authorization.  ? ?Follow Up Recommendations ? Home health OT  ?  ?Assistance Recommended at Discharge Frequent or constant Supervision/Assistance  ?Patient can return home with the following A lot of help with walking and/or transfers;A lot of help with bathing/dressing/bathroom;Assistance with cooking/housework;Direct supervision/assist for financial management;Direct supervision/assist for medications management ? ?  ?Functional Status Assessment ? Patient has had a recent decline in their functional status and demonstrates the ability to make significant improvements in function in a reasonable and predictable amount of time.   ?Equipment Recommendations ? None recommended by OT  ?  ?Recommendations for Other Services   ? ? ?  ?Precautions / Restrictions Precautions ?Precautions: Fall ?Precaution Comments: painful right knee ?Restrictions ?Weight Bearing Restrictions: No  ? ?  ? ?Mobility Bed Mobility ?Overal bed mobility: Needs Assistance ?Bed Mobility: Sit to Supine ?  ?  ?  ?Sit to supine: Mod assist ?  ?  ?  ? ?Transfers ?Overall transfer level: Needs assistance ?Equipment used: None ?Transfers: Sit to/from Stand, Bed to chair/wheelchair/BSC ?Sit to Stand: Mod assist ?Stand pivot transfers: Max assist ?  ?  ?  ?  ?General transfer comment: Pt needed max assist for stand pivot secondary to left knee pain and lethargy. ?  ? ?  ?Balance Overall balance assessment: Needs assistance ?Sitting-balance support: Bilateral upper extremity supported ?Sitting balance-Leahy Scale: Fair ?  ?  ?Standing balance support: Bilateral upper extremity supported ?Standing balance-Leahy Scale: Zero ?  ?  ?  ?  ?  ?  ?  ?  ?  ?  ?  ?  ?   ? ?ADL either performed or assessed with clinical judgement  ? ?ADL Overall ADL's : Needs assistance/impaired ?  ?  ?Grooming: Dance movement psychotherapist;Set up ?  ?Upper Body Bathing: Minimal assistance;Sitting ?  ?Lower Body Bathing: Maximal assistance;Bed level ?  ?Upper Body Dressing : Moderate assistance;Bed level ?Upper Body Dressing Details (indicate cue type and reason): hospital gown ?  ?  ?Toilet Transfer: Maximal assistance;Stand-pivot ?Toilet Transfer Details (indicate cue type and reason): simulated to the bed from the bedside chair ?Toileting- Clothing Manipulation and Hygiene: Maximal assistance;Sit to/from stand ?  ?  ?  ?  ?General ADL Comments: Pt incontinent of  bladder when completing transfer stand pivot to the bed from the recliner.  Mod assist with mod demonstrational cueing for rolling in the bed while therapist helped change sheets.  max assist for donning new brief as well but pt did assist with washing her  front peri area and part of her upper legs.  ? ? ? ?Vision Baseline Vision/History: 0 No visual deficits ?Ability to See in Adequate Light: 0 Adequate ?Patient Visual Report: No change from baseline ?Vision Assessment?: No apparent visual deficits ?Additional Comments: Pt reports not wearing glasses prior to admission and being able to see normally for her baseline.  ?   ?Perception Perception ?Perception: Within Functional Limits ?  ?Praxis Praxis ?Praxis: Intact ?  ? ?Pertinent Vitals/Pain Pain Assessment ?Pain Assessment: Faces ?Faces Pain Scale: Hurts little more ?Pain Location: R knee ?Pain Descriptors / Indicators: Grimacing, Guarding ?Pain Intervention(s): Limited activity within patient's tolerance, Monitored during session  ? ? ? ?Hand Dominance Right ?  ?Extremity/Trunk Assessment Upper Extremity Assessment ?Upper Extremity Assessment: RUE deficits/detail ?RUE Deficits / Details: Pt with limited AROM elbow and shoulder flexion.  She needed assist to bring hand up to her nose with the LUE and when therapist attempted to complete shoulder flexion she voiced pain with movement greater than 50 degrees.  Will continue to assess further in treatment as she did not have UE impairments per her spouse. ?RUE: Shoulder pain with ROM ?RUE Sensation: WNL ?RUE Coordination: decreased gross motor ?  ?Lower Extremity Assessment ?Lower Extremity Assessment: Defer to PT evaluation ?RLE Deficits / Details: Valgus posturing at the knee with increased swelling. Pt reports this is baseline ?  ?Cervical / Trunk Assessment ?Cervical / Trunk Assessment: Kyphotic ?  ?Communication Communication ?Communication: No difficulties ?  ?Cognition Arousal/Alertness: Lethargic ?Behavior During Therapy: Flat affect ?Overall Cognitive Status: Impaired/Different from baseline ?Area of Impairment: Following commands, Attention ?  ?  ?  ?  ?  ?  ?  ?  ?  ?Current Attention Level: Focused (cueing need to keep eyes open for any period ot time.) ?   ?Following Commands: Follows one step commands with increased time ?  ?  ?  ?General Comments: Limited session secondary to pt's alertness level secondary to pain meds per spouse report.  She would open her eyes briefly during transitional movements but then would close them.  At times she would ask questions that appeared to represent hallucinations.  "Is the angel still in the room?" ?  ?  ?General Comments  Pt's husband present throughout ? ?  ?   ? ?Home Living Family/patient expects to be discharged to:: Private residence ?Living Arrangements: Spouse/significant other ?Available Help at Discharge: Family ?Type of Home: House ?Home Access: Level entry ?  ?  ?Home Layout: One level ?  ?  ?Bathroom Shower/Tub: Walk-in shower;Sponge bathes at baseline ?  ?Bathroom Toilet: Standard ?Bathroom Accessibility: Yes ?How Accessible: Other (comment) (wheelchair will not fit, pt walks from the door holding onto the sink and other surfaces per spouse report) ?Home Equipment: Conservation officer, nature (2 wheels);Cane - single point;Shower seat;BSC/3in1;Wheelchair - manual ?  ?Additional Comments: PLOF provided by spouse as pt remained lethargic throughout session. ?  ? ?  ?Prior Functioning/Environment Prior Level of Function : Needs assist ?  ?  ?  ?Physical Assist : ADLs (physical) ?  ?ADLs (physical): Dressing;Toileting ?Mobility Comments: Husband sometimes assists with ADLs and transfers. Pt sometimes able to perform on her own ?ADLs Comments: Husband assists with ADLs. ?  ? ?  ?  ?  OT Problem List: Decreased strength;Decreased knowledge of use of DME or AE;Decreased range of motion;Decreased activity tolerance;Decreased cognition;Impaired balance (sitting and/or standing);Pain;Decreased safety awareness;Impaired UE functional use ?  ?   ?OT Treatment/Interventions: Self-care/ADL training;Therapeutic exercise;Neuromuscular education;DME and/or AE instruction;Cognitive remediation/compensation;Therapeutic activities;Patient/family  education;Balance training  ?  ?OT Goals(Current goals can be found in the care plan section) Acute Rehab OT Goals ?Patient Stated Goal: Pt did not state, but spouse feels comfortable assisting her at her current

## 2021-04-29 ENCOUNTER — Telehealth: Payer: Self-pay

## 2021-04-29 NOTE — Telephone Encounter (Signed)
Tamara Howe from [facility] called to inquire about [place] orders and a questionable wound? No wound documented. She wants [place] to call the PCP to make sure someone will sign HH orders. Called [facility], no answer x2. Third time on hold. Will call back with information for their follow up. ?

## 2022-03-03 ENCOUNTER — Encounter (HOSPITAL_BASED_OUTPATIENT_CLINIC_OR_DEPARTMENT_OTHER): Payer: Medicare Other | Admitting: General Surgery

## 2022-03-09 DEATH — deceased
